# Patient Record
Sex: Female | Born: 1965 | Race: Black or African American | Hispanic: No | Marital: Single | Smoking: Never smoker
Health system: Southern US, Community
[De-identification: ages and names within clinical notes are randomized; demographics above are authoritative.]

## PROBLEM LIST (undated history)

## (undated) DIAGNOSIS — F79 Unspecified intellectual disabilities: Secondary | ICD-10-CM

## (undated) DIAGNOSIS — K219 Gastro-esophageal reflux disease without esophagitis: Secondary | ICD-10-CM

## (undated) DIAGNOSIS — F209 Schizophrenia, unspecified: Secondary | ICD-10-CM

## (undated) DIAGNOSIS — J45909 Unspecified asthma, uncomplicated: Secondary | ICD-10-CM

## (undated) DIAGNOSIS — K529 Noninfective gastroenteritis and colitis, unspecified: Secondary | ICD-10-CM

## (undated) HISTORY — DX: Gastro-esophageal reflux disease without esophagitis: K21.9

## (undated) HISTORY — DX: Noninfective gastroenteritis and colitis, unspecified: K52.9

## (undated) HISTORY — DX: Unspecified intellectual disabilities: F79

## (undated) HISTORY — DX: Schizophrenia, unspecified: F20.9

## (undated) HISTORY — PX: OTHER SURGICAL HISTORY: SHX169

---

## 2014-09-03 ENCOUNTER — Encounter: Payer: Self-pay | Admitting: Internal Medicine

## 2014-09-17 ENCOUNTER — Encounter: Payer: Self-pay | Admitting: Gastroenterology

## 2014-09-17 ENCOUNTER — Telehealth: Payer: Self-pay | Admitting: Gastroenterology

## 2014-09-17 ENCOUNTER — Ambulatory Visit (INDEPENDENT_AMBULATORY_CARE_PROVIDER_SITE_OTHER): Payer: Medicaid Other | Admitting: Gastroenterology

## 2014-09-17 ENCOUNTER — Other Ambulatory Visit: Payer: Self-pay

## 2014-09-17 VITALS — BP 117/77 | HR 73 | Temp 97.8°F | Ht 60.0 in | Wt 176.2 lb

## 2014-09-17 DIAGNOSIS — R197 Diarrhea, unspecified: Secondary | ICD-10-CM

## 2014-09-17 DIAGNOSIS — R1314 Dysphagia, pharyngoesophageal phase: Secondary | ICD-10-CM | POA: Insufficient documentation

## 2014-09-17 DIAGNOSIS — R109 Unspecified abdominal pain: Secondary | ICD-10-CM

## 2014-09-17 MED ORDER — ELUXADOLINE 100 MG PO TABS
1.0000 | ORAL_TABLET | Freq: Two times a day (BID) | ORAL | Status: DC
Start: 2014-09-17 — End: 2014-11-06

## 2014-09-17 MED ORDER — PANTOPRAZOLE SODIUM 40 MG PO TBEC: 40.0000 mg | DELAYED_RELEASE_TABLET | Freq: Every day | ORAL | Status: DC

## 2014-09-17 MED ORDER — PEG 3350-KCL-NA BICARB-NACL 420 G PO SOLR: 4000.0000 mL | Freq: Once | ORAL | Status: DC

## 2014-09-17 NOTE — Telephone Encounter (Signed)
I spoke with Evelyn Allen and she is aware of medication change.

## 2014-09-17 NOTE — Assessment & Plan Note (Signed)
Chronic, pointing to cervical and mid esophagus as culprit, refractory GERD noted despite Prilosec 40 mg daily and Zantac BID. Will proceed with EGD with dilation as appropriate. Query uncontrolled GERD, possible web, ring, or stricture.   Proceed with upper endoscopy and dilation in the near future with Dr. Jena Gaussourk. The risks, benefits, and alternatives have been discussed in detail with patient. They have stated understanding and desire to proceed.  PROPOFOL due to polypharmacy Will trial Protonix 40 mg daily in interim (discontinue Prilosec to exclude any contributor to diarrhea or abdominal pain)

## 2014-09-17 NOTE — Telephone Encounter (Signed)
After patient left, I decided to have her stop Prilosec and start Protonix 40 mg once daily. I have sent this to the pharmacy. Please call the home and make them aware. Thanks!

## 2014-09-17 NOTE — Patient Instructions (Signed)
We have scheduled you for a colonoscopy, upper endoscopy, and possible dilation with Dr. Jena Gaussourk in the near future.  For diarrhea and belly pain: start taking Viberzi 1 tablet WITH FOOD twice a day. IF you have worsening abdominal pain, nausea, vomiting, or severe constipation, call us immediately.

## 2014-09-17 NOTE — Assessment & Plan Note (Addendum)
49 year old mentally-challenged female with history of chronic diarrhea and abdominal pain, with unrevealing labs thus far and reportedly normal ultrasound on file, likely dealing with IBS but unable to exclude occult etiology. With her history of schizophrenia and mentally challenged, it is difficult to obtain a complete history regarding abdominal pain and signs/symptoms. As diarrhea appears chronic and is not an acute finding, will hold on stool studies and empirically trial Viberzi 100 mg BID with food in interim until colonoscopy completed. She has never had a lower GI evaluation and is overdue as she is African-American and age 49. Unknown family history. Low-volume intermittent hematochezia likely benign anorectal source.  Will proceed with colonoscopy in the near future.   Proceed with TCS with Dr. Jena Gaussourk in near future: the risks, benefits, and alternatives have been discussed with the patient in detail. The patient states understanding and desires to proceed. PROPOFOL due to polypharmacy 2 days of clear liquids prior to assist with prepping Viberzi 100 mg BID with food: discussed side effects and warning signs/symptoms that would prompt attention

## 2014-09-17 NOTE — Progress Notes (Signed)
cc'ed to pcp °

## 2014-09-17 NOTE — Assessment & Plan Note (Signed)
Likely multifactorial. No significant concerning signs; she has lost a few pounds in the past month but appetite remains good. US reportedly normal and will request results. Labs unimpressive. Although gallbladder remains in situ, doubt biliary component. Proceeding with colonoscopy and EGD due to dysphagia. Consider HIDA if all normal and any biliary type symptoms.

## 2014-09-17 NOTE — Progress Notes (Signed)
    Primary Care Physician:  [facility] Primary Gastroenterologist:  Dr. Rourk   Chief Complaint  Patient presents with  . Abdominal Pain  . Diarrhea    HPI:   Evelyn Allen is a 49 y.o. female presenting today at the request of Anthony Roberson, PA, secondary to chronic abdominal pain and diarrhea. She is here with her caregiver, who she calls "Momma". History of schizophrenia and mentally challenged. However, she is able to give a limited history.   Abdominal pain located at navel, intermittent for approximately 2 years.  Pain worsened after eating. No N/V. Caregiver states back and forth to the bathroom "all day long". Possible low-volume hematochezia. No prior colonoscopy.   Points to cervical and mid esophagus complaining of pain all the time. Food gets "hung" sometimes.Heartburn/indigestion at times. Prilosec 40 mg each day and Zantac BID. Possibly 3 lbs weight loss in past month. Good appetite.   Resides at [facility]. Reportedly ultrasound of abdomen normal per PCP notes, but this is not available at time of visit. Labs listed below, unimpressive.    Past Medical History  Diagnosis Date  . GERD (gastroesophageal reflux disease)   . Chronic diarrhea   . Mentally challenged   . Schizophrenia     Past Surgical History  Procedure Laterality Date  . None      Current Outpatient Prescriptions  Medication Sig Dispense Refill  . acetaminophen (TYLENOL) 500 MG tablet Take 500 mg by mouth every 6 (six) hours as needed.    . albuterol (PROVENTIL HFA;VENTOLIN HFA) 108 (90 BASE) MCG/ACT inhaler Inhale into the lungs every 6 (six) hours as needed for wheezing or shortness of breath.    . ARIPiprazole (ABILIFY) 30 MG tablet Take 30 mg by mouth daily.    . carbamazepine (TEGRETOL) 200 MG tablet Take 200 mg by mouth 2 (two) times daily.    . cetirizine (ZYRTEC) 10 MG tablet Take 10 mg by mouth daily.    . fluticasone (VERAMYST) 27.5  MCG/SPRAY nasal spray Place 2 sprays into the nose daily.    . Fluticasone-Salmeterol (ADVAIR) 250-50 MCG/DOSE AEPB Inhale 1 puff into the lungs 2 (two) times daily.    . gabapentin (NEURONTIN) 400 MG capsule Take 400 mg by mouth 3 (three) times daily.    . guanFACINE (TENEX) 1 MG tablet Take 1 mg by mouth at bedtime. 4 tablets at bedtime    . Homeopathic Products (SIMILASAN ALLERGY EYE RELIEF OP) Apply to eye. As directed    . Melatonin 3 MG CAPS Take by mouth at bedtime.    . naproxen (NAPROSYN) 500 MG tablet Take 500 mg by mouth daily.    . NON FORMULARY Calcium 500 plus Vit D 400   bid    . omeprazole (PRILOSEC) 40 MG capsule Take 40 mg by mouth daily.    . prazosin (MINIPRESS) 1 MG capsule Take 1 mg by mouth at bedtime.    . ranitidine (ZANTAC) 150 MG capsule Take 150 mg by mouth 2 (two) times daily.    . traZODone (DESYREL) 100 MG tablet Take 100 mg by mouth at bedtime. Two tablets at bedtime     No current facility-administered medications for this visit.    Allergies as of 09/17/2014 - Review Complete 09/17/2014  Allergen Reaction Noted  . Penicillins Itching and Rash 09/17/2014    Family History  Problem Relation Age of Onset  . Colon cancer      unknown       History   Social History  . Marital Status: Single    Spouse Name: N/A  . Number of Children: N/A  . Years of Education: N/A   Occupational History  . Not on file.   Social History Main Topics  . Smoking status: Never Smoker   . Smokeless tobacco: Not on file     Comment: Never smoked  . Alcohol Use: No  . Drug Use: No  . Sexual Activity: Not on file   Other Topics Concern  . Not on file   Social History Narrative  . No narrative on file    Review of Systems: Gen: Denies any fever, chills, fatigue, weight loss, lack of appetite.  CV: +palpitations Resp: +DOE GI: see HPI GU : Denies urinary burning, urinary frequency, urinary hesitancy MS: Denies joint pain, muscle weakness, cramps, or limitation  of movement.  Derm: Denies rash, itching, dry skin Psych: Denies depression, anxiety, memory loss, and confusion Heme: Denies bruising, bleeding, and enlarged lymph nodes.  Physical Exam: BP 117/77 mmHg  Pulse 73  Temp(Src) 97.8 F (36.6 C) (Oral)  Ht 5' (1.524 m)  Wt 176 lb 3.2 oz (79.924 kg)  BMI 34.41 kg/m2 General:   Alert and oriented. Flat affect but responds appropriately to questions.  Head:  Normocephalic and atraumatic. Eyes:  Without icterus, sclera clear and conjunctiva pink.  Ears:  Normal auditory acuity. Nose:  No deformity, discharge,  or lesions. Mouth:  No deformity or lesions, oral mucosa pink.  Lungs:  Clear to auscultation bilaterally. No wheezes, rales, or rhonchi. No distress.  Heart:  S1, S2 present without murmurs appreciated.  Abdomen:  +BS, soft, mild TTP diffusely but moreso upper abdomen and non-distended. No HSM noted. No guarding or rebound. No masses appreciated.  Rectal:  Deferred  Msk:  Symmetrical without gross deformities. Normal posture. Extremities:  Without edema. Neurologic:  Alert and  oriented to person, situation Skin:  Intact without significant lesions or rashes. Psych:  Alert and cooperative. Normal mood and affect.  Outside labs April 2016:  Tbili 0.4, Alk Phos 134, AST 16, ALT 12, Lipase 28, GGT normal at 54, Hgb 14.3, Hct 45.4, Platelets 151 

## 2014-09-19 ENCOUNTER — Telehealth: Payer: Self-pay | Admitting: Nurse Practitioner

## 2014-09-19 NOTE — Telephone Encounter (Signed)
Received a call about this patient. She was seen by AS this past Tuesday and started on Viberzi. Patient now having worsening abdominal pain. No nausea/vomiting. Advised the patient to stop taking the medication. If her pain worsens or does not improve she should be evaluated in the ER.

## 2014-09-22 NOTE — Telephone Encounter (Signed)
Agree 

## 2014-09-26 NOTE — Patient Instructions (Signed)
Evelyn Allen  09/26/2014     @PREFPERIOPPHARMACY @   Your procedure is scheduled on 10/02/2014 Report to [facility] at  930  A.M.  Call this number if you have problems the morning of surgery:  [phone]   Remember:  Do not eat food or drink liquids after midnight.  Take these medicines the morning of surgery with A SIP OF WATER  Abilify, tegretol, zyrtec, neurontin, protonix,minipress, zantac. Take your inhalers before you come and bring them with you.   Do not wear jewelry, make-up or nail polish.  Do not wear lotions, powders, or perfumes.    Do not shave 48 hours prior to surgery.  Men may shave face and neck.  Do not bring valuables to the hospital.  [facility] is not responsible for any belongings or valuables.  Contacts, dentures or bridgework may not be worn into surgery.  Leave your suitcase in the car.  After surgery it may be brought to your room.  For patients admitted to the hospital, discharge time will be determined by your treatment team.  Patients discharged the day of surgery will not be allowed to drive home.   Name and phone number of your driver:   family Special instructions:  none  Please read over the following fact sheets that you were given. Pain Booklet, Coughing and Deep Breathing, Surgical Site Infection Prevention, Anesthesia Post-op Instructions and Care and Recovery After Surgery      Esophagogastroduodenoscopy Esophagogastroduodenoscopy (EGD) is a procedure to examine the lining of the esophagus, stomach, and first part of the small intestine (duodenum). A long, flexible, lighted tube with a camera attached (endoscope) is inserted down the throat to view these organs. This procedure is done to detect problems or abnormalities, such as inflammation, bleeding, ulcers, or growths, in order to treat them. The procedure lasts about 5-20 minutes. It is usually an outpatient procedure, but it may need to be performed in emergency  cases in the hospital. LET YOUR CAREGIVER KNOW ABOUT:   Allergies to food or medicine.  All medicines you are taking, including vitamins, herbs, eyedrops, and over-the-counter medicines and creams.  Use of steroids (by mouth or creams).  Previous problems you or members of your family have had with the use of anesthetics.  Any blood disorders you have.  Previous surgeries you have had.  Other health problems you have.  Possibility of pregnancy, if this applies. RISKS AND COMPLICATIONS  Generally, EGD is a safe procedure. However, as with any procedure, complications can occur. Possible complications include:  Infection.  Bleeding.  Tearing (perforation) of the esophagus, stomach, or duodenum.  Difficulty breathing or not being able to breath.  Excessive sweating.  Spasms of the larynx.  Slowed heartbeat.  Low blood pressure. BEFORE THE PROCEDURE  Do not eat or drink anything for 6-8 hours before the procedure or as directed by your caregiver.  Ask your caregiver about changing or stopping your regular medicines.  If you wear dentures, be prepared to remove them before the procedure.  Arrange for someone to drive you home after the procedure. PROCEDURE   A vein will be accessed to give medicines and fluids. A medicine to relax you (sedative) and a pain reliever will be given through that access into the vein.  A numbing medicine (local anesthetic) may be sprayed on your throat for comfort and to stop you from gagging or coughing.  A mouth guard may be placed  in your mouth to protect your teeth and to keep you from biting on the endoscope.  You will be asked to lie on your left side.  The endoscope is inserted down your throat and into the esophagus, stomach, and duodenum.  Air is put through the endoscope to allow your caregiver to view the lining of your esophagus clearly.  The esophagus, stomach, and duodenum is then examined. During the exam, your  caregiver may:  Remove tissue to be examined under a microscope (biopsy) for inflammation, infection, or other medical problems.  Remove growths.  Remove objects (foreign bodies) that are stuck.  Treat any bleeding with medicines or other devices that stop tissues from bleeding (hot cautery, clipping devices).  Widen (dilate) or stretch narrowed areas of the esophagus and stomach.  The endoscope will then be withdrawn. AFTER THE PROCEDURE  You will be taken to a recovery area to be monitored. You will be able to go home once you are stable and alert.  Do not eat or drink anything until the local anesthetic and numbing medicines have worn off. You may choke.  It is normal to feel bloated, have pain with swallowing, or have a sore throat for a short time. This will wear off.  Your caregiver should be able to discuss his or her findings with you. It will take longer to discuss the test results if any biopsies were taken. Document Released: 08/19/2004 Document Revised: 09/02/2013 Document Reviewed: 03/21/2012 [facility] Patient Information 2015 [place], [place]. This information is not intended to replace advice given to you by your health care provider. Make sure you discuss any questions you have with your health care provider. Esophageal Dilatation The esophagus is the long, narrow tube which carries food and liquid from the mouth to the stomach. Esophageal dilatation is the technique used to stretch a blocked or narrowed portion of the esophagus. This procedure is used when a part of the esophagus has become so narrow that it becomes difficult, painful or even impossible to swallow. This is generally an uncomplicated form of treatment. When this is not successful, chest surgery may be required. This is a much more extensive form of treatment with a longer recovery time. CAUSES  Some of the more common causes of blockage or strictures of the esophagus are:  Narrowing from longstanding  inflammation (soreness and redness) of the lower esophagus. This comes from the constant exposure of the lower esophagus to the acid which bubbles up from the stomach. Over time this causes scarring and narrowing of the lower esophagus.  Hiatal hernia in which a small part of the stomach bulges (herniates) up through the diaphragm. This can cause a gradual narrowing of the end of the esophagus.  Schatzki ring is a narrow ring of benign (non-cancerous) fibrous tissue which constricts the lower esophagus. The reason for this is not known.  Scleroderma is a connective tissue disorder that affects the esophagus and makes swallowing difficult.  Achalasia is an absence of nerves to the lower esophagus and to the esophageal sphincter. This is the circular muscle between the stomach and esophagus that relaxes to allow food into the stomach. After swallowing, it contracts to keep food in the stomach. This absence of nerves may be congenital (present since birth). This can cause irregular spasms of the lower esophageal muscle. This spasm does not open up to allow food and fluid through. The result is a persistent blockage with subsequent slow trickling of the esophageal contents into the stomach.  Strictures  may develop from swallowing materials which damage the esophagus. Some examples are strong acids or alkalis such as lye.  Growths such as benign (non-cancerous) and malignant (cancerous) tumors can block the esophagus.  Hereditary (present since birth) causes. DIAGNOSIS  Your caregiver often suspects this problem by taking a medical history. They will also do a physical exam. They can then prove their suspicions using X-rays and endoscopy. Endoscopy is an exam in which a tube like a small, flexible telescope is used to look at your esophagus.  TREATMENT There are different stretching (dilating) techniques that can be used. Simple bougie dilatation may be done in the office. This usually takes only a  couple minutes. A numbing (anesthetic) spray of the throat is used. Endoscopy, when done, is done in an endoscopy suite under mild sedation. When fluoroscopy is used, the procedure is performed in X-ray. Other techniques require a little longer time. Recovery is usually quick. There is no waiting time to begin eating and drinking to test success of the treatment. Following are some of the methods used. Narrowing of the esophagus is treated by making it bigger. Commonly this is a mechanical problem which can be treated with stretching. This can be done in different ways. Your caregiver will discuss these with you. Some of the means used are:  A series of graduated (increasing thickness) flexible dilators can be used. These are weighted tubes passed through the esophagus into the stomach. The tubes used become progressively larger until the desired stretched size is reached. Graduated dilators are a simple and quick way of opening the esophagus. No visualization is required.  Another method is the use of endoscopy to place a flexible wire across the stricture. The endoscope is removed and the wire left in place. A dilator with a hole through it from end to end is guided down the esophagus and across the stricture. One or more of these dilators are passed over the wire. At the end of the exam, the wire is removed. This type of treatment may be performed in the X-ray department under fluoroscopy. An advantage of this procedure is the examiner is visualizing the end opening in the esophagus.  Stretching of the esophagus may be done using balloons. Deflated balloons are placed through the endoscope and across the stricture. This type of balloon dilatation is often done at the time of endoscopy or fluoroscopy. Flexible endoscopy allows the examiner to directly view the stricture. A balloon is inserted in the deflated form into the area of narrowing. It is then inflated with air to a certain pressure that is preset  for a given circumference. When inflated, it becomes sausage shaped, stretched, and makes the stricture larger.  Achalasia requires a longer, larger balloon-type dilator. This is frequently done under X-ray control. In this situation, the spastic muscle fibers in the lower esophagus are stretched. All of the above procedures make the passage of food and water into the stomach easier. They also make it easier for stomach contents to reflux back into the esophagus. Special medications may be used following the procedure to help prevent further stricturing. Proton-pump inhibitor medications are good at decreasing the amount of acid in the stomach juice. When stomach juice refluxes into the esophagus, the juice is no longer as acidic and is less likely to burn or scar the esophagus. RISKS AND COMPLICATIONS Esophageal dilatation is usually performed effectively and without problems. Some complications that can occur are:  A small amount of bleeding almost always happens where  the stretching takes place. If this is too excessive it may require more aggressive treatment.  An uncommon complication is perforation (making a hole) of the esophagus. The esophagus is thin. It is easy to make a hole in it. If this happens, an operation may be necessary to repair this.  A small, undetected perforation could lead to an infection in the chest. This can be very serious. HOME CARE INSTRUCTIONS   If you received sedation for your procedure, do not drive, make important decisions, or perform any activities requiring your full coordination. Do not drink alcohol, take sedatives, or use any mind altering chemicals unless instructed by your caregiver.  You may use throat lozenges or warm salt water gargles if you have throat discomfort.  You can begin eating and drinking normally on return home unless instructed otherwise. Do not purposely try to force large chunks of food down to test the benefits of your  procedure.  Mild discomfort can be eased with sips of ice water.  Medications for discomfort may or may not be needed. SEEK IMMEDIATE MEDICAL CARE IF:   You begin vomiting up blood.  You develop black, tarry stools.  You develop chills or an unexplained temperature of over 101F (38.3C)  You develop chest or abdominal pain.  You develop shortness of breath, or feel light-headed or faint.  Your swallowing is becoming more painful, difficult, or you are unable to swallow. MAKE SURE YOU:   Understand these instructions.  Will watch your condition.  Will get help right away if you are not doing well or get worse. Document Released: 06/09/2005 Document Revised: 09/02/2013 Document Reviewed: 07/27/2005 [facility] Patient Information 2015 [place], [place]. This information is not intended to replace advice given to you by your health care provider. Make sure you discuss any questions you have with your health care provider. Colonoscopy A colonoscopy is an exam to look at the entire large intestine (colon). This exam can help find problems such as tumors, polyps, inflammation, and areas of bleeding. The exam takes about 1 hour.  LET [facility] CARE PROVIDER KNOW ABOUT:   Any allergies you have.  All medicines you are taking, including vitamins, herbs, eye drops, creams, and over-the-counter medicines.  Previous problems you or members of your family have had with the use of anesthetics.  Any blood disorders you have.  Previous surgeries you have had.  Medical conditions you have. RISKS AND COMPLICATIONS  Generally, this is a safe procedure. However, as with any procedure, complications can occur. Possible complications include:  Bleeding.  Tearing or rupture of the colon wall.  Reaction to medicines given during the exam.  Infection (rare). BEFORE THE PROCEDURE   Ask your health care provider about changing or stopping your regular medicines.  You may be prescribed an  oral bowel prep. This involves drinking a large amount of medicated liquid, starting the day before your procedure. The liquid will cause you to have multiple loose stools until your stool is almost clear or light green. This cleans out your colon in preparation for the procedure.  Do not eat or drink anything else once you have started the bowel prep, unless your health care provider tells you it is safe to do so.  Arrange for someone to drive you home after the procedure. PROCEDURE   You will be given medicine to help you relax (sedative).  You will lie on your side with your knees bent.  A long, flexible tube with a light and camera on  the end (colonoscope) will be inserted through the rectum and into the colon. The camera sends video back to a computer screen as it moves through the colon. The colonoscope also releases carbon dioxide gas to inflate the colon. This helps your health care provider see the area better.  During the exam, your health care provider may take a small tissue sample (biopsy) to be examined under a microscope if any abnormalities are found.  The exam is finished when the entire colon has been viewed. AFTER THE PROCEDURE   Do not drive for 24 hours after the exam.  You may have a small amount of blood in your stool.  You may pass moderate amounts of gas and have mild abdominal cramping or bloating. This is caused by the gas used to inflate your colon during the exam.  Ask when your test results will be ready and how you will get your results. Make sure you get your test results. Document Released: 04/15/2000 Document Revised: 02/06/2013 Document Reviewed: 12/24/2012 [facility] Patient Information 2015 [place], [place]. This information is not intended to replace advice given to you by your health care provider. Make sure you discuss any questions you have with your health care provider. PATIENT INSTRUCTIONS POST-ANESTHESIA  IMMEDIATELY FOLLOWING SURGERY:  Do not  drive or operate machinery for the first twenty four hours after surgery.  Do not make any important decisions for twenty four hours after surgery or while taking narcotic pain medications or sedatives.  If you develop intractable nausea and vomiting or a severe headache please notify your doctor immediately.  FOLLOW-UP:  Please make an appointment with your surgeon as instructed. You do not need to follow up with anesthesia unless specifically instructed to do so.  WOUND CARE INSTRUCTIONS (if applicable):  Keep a dry clean dressing on the anesthesia/puncture wound site if there is drainage.  Once the wound has quit draining you may leave it open to air.  Generally you should leave the bandage intact for twenty four hours unless there is drainage.  If the epidural site drains for more than 36-48 hours please call the anesthesia department.  QUESTIONS?:  Please feel free to call your physician or the hospital operator if you have any questions, and they will be happy to assist you.

## 2014-09-30 ENCOUNTER — Other Ambulatory Visit: Payer: Self-pay

## 2014-09-30 ENCOUNTER — Encounter (HOSPITAL_COMMUNITY): Payer: Self-pay

## 2014-09-30 ENCOUNTER — Telehealth: Payer: Self-pay | Admitting: Internal Medicine

## 2014-09-30 ENCOUNTER — Encounter (HOSPITAL_COMMUNITY)
Admission: RE | Admit: 2014-09-30 | Discharge: 2014-09-30 | Disposition: A | Discharge status: Home / Self Care | Payer: Medicaid Other | Source: Ambulatory Visit | Attending: Internal Medicine | Admitting: Internal Medicine

## 2014-09-30 DIAGNOSIS — Z01818 Encounter for other preprocedural examination: Secondary | ICD-10-CM | POA: Insufficient documentation

## 2014-09-30 DIAGNOSIS — R197 Diarrhea, unspecified: Secondary | ICD-10-CM | POA: Insufficient documentation

## 2014-09-30 HISTORY — DX: Unspecified asthma, uncomplicated: J45.909

## 2014-09-30 LAB — BASIC METABOLIC PANEL
Anion gap: 9 (ref 5–15)
BUN: 9 mg/dL (ref 6–20)
CHLORIDE: 104 mmol/L (ref 101–111)
CO2: 25 mmol/L (ref 22–32)
Calcium: 9.6 mg/dL (ref 8.9–10.3)
Creatinine, Ser: 0.99 mg/dL (ref 0.44–1.00)
GFR calc Af Amer: >60 mL/min (ref >60)
GLUCOSE: 97 mg/dL (ref 65–99)
POTASSIUM: 4.7 mmol/L (ref 3.5–5.1)
Sodium: 138 mmol/L (ref 135–145)

## 2014-09-30 LAB — HCG, SERUM, QUALITATIVE: PREG SERUM: NEGATIVE

## 2014-09-30 LAB — CBC WITH DIFFERENTIAL/PLATELET
Basophils Absolute: 0 10*3/uL (ref 0.0–0.1)
Basophils Relative: 0 % (ref 0–1)
Eosinophils Absolute: 0 10*3/uL (ref 0.0–0.7)
Eosinophils Relative: 0 % (ref 0–5)
HCT: 42.9 % (ref 36.0–46.0)
HEMOGLOBIN: 14.1 g/dL (ref 12.0–15.0)
LYMPHS ABS: 2.5 10*3/uL (ref 0.7–4.0)
Lymphocytes Relative: 29 % (ref 12–46)
MCH: 30.4 pg (ref 26.0–34.0)
MCHC: 32.9 g/dL (ref 30.0–36.0)
MCV: 92.5 fL (ref 78.0–100.0)
MONO ABS: 0.8 10*3/uL (ref 0.1–1.0)
Monocytes Relative: 9 % (ref 3–12)
Neutro Abs: 5.4 10*3/uL (ref 1.7–7.7)
Neutrophils Relative %: 62 % (ref 43–77)
PLATELETS: 168 10*3/uL (ref 150–400)
RBC: 4.64 MIL/uL (ref 3.87–5.11)
RDW: 12.7 % (ref 11.5–15.5)
WBC: 8.6 10*3/uL (ref 4.0–10.5)

## 2014-09-30 NOTE — Telephone Encounter (Signed)
The caregiver for patient came to front window this morning saying that she needed a consent form stating why the patient needed this procedure done and have it faxed to [phone] before her procedure on 10/02/14. She is a ward of the state according to her caregiver. Any questions you can call her at [phone]

## 2014-09-30 NOTE — Telephone Encounter (Signed)
Spoke with Selena BattenKim at Day surgery and she has contact numbers for pts guardian to sign consent

## 2014-09-30 NOTE — Pre-Procedure Instructions (Signed)
Caregiver, Dolores HooseBrenda Corbitt given inforamtion to sign up for my chart at home.

## 2014-10-02 ENCOUNTER — Ambulatory Visit (HOSPITAL_COMMUNITY)
Admission: RE | Admit: 2014-10-02 | Discharge: 2014-10-02 | Disposition: A | Discharge status: Home / Self Care | Payer: Medicaid Other | Source: Ambulatory Visit | Attending: Internal Medicine | Admitting: Internal Medicine

## 2014-10-02 ENCOUNTER — Encounter (HOSPITAL_COMMUNITY): Payer: Self-pay | Admitting: *Deleted

## 2014-10-02 ENCOUNTER — Encounter (HOSPITAL_COMMUNITY)
Admission: RE | Disposition: A | Discharge status: Home / Self Care | Payer: Self-pay | Source: Ambulatory Visit | Attending: Internal Medicine

## 2014-10-02 ENCOUNTER — Ambulatory Visit (HOSPITAL_COMMUNITY): Payer: Medicaid Other | Admitting: Anesthesiology

## 2014-10-02 ENCOUNTER — Other Ambulatory Visit: Payer: Self-pay

## 2014-10-02 DIAGNOSIS — R1084 Generalized abdominal pain: Secondary | ICD-10-CM | POA: Insufficient documentation

## 2014-10-02 DIAGNOSIS — Z1211 Encounter for screening for malignant neoplasm of colon: Secondary | ICD-10-CM | POA: Insufficient documentation

## 2014-10-02 DIAGNOSIS — F79 Unspecified intellectual disabilities: Secondary | ICD-10-CM | POA: Diagnosis not present

## 2014-10-02 DIAGNOSIS — R197 Diarrhea, unspecified: Secondary | ICD-10-CM | POA: Diagnosis present

## 2014-10-02 DIAGNOSIS — K5730 Diverticulosis of large intestine without perforation or abscess without bleeding: Secondary | ICD-10-CM | POA: Insufficient documentation

## 2014-10-02 DIAGNOSIS — F209 Schizophrenia, unspecified: Secondary | ICD-10-CM | POA: Diagnosis not present

## 2014-10-02 DIAGNOSIS — K529 Noninfective gastroenteritis and colitis, unspecified: Secondary | ICD-10-CM

## 2014-10-02 DIAGNOSIS — K219 Gastro-esophageal reflux disease without esophagitis: Secondary | ICD-10-CM | POA: Diagnosis not present

## 2014-10-02 DIAGNOSIS — J45909 Unspecified asthma, uncomplicated: Secondary | ICD-10-CM | POA: Insufficient documentation

## 2014-10-02 DIAGNOSIS — Z791 Long term (current) use of non-steroidal anti-inflammatories (NSAID): Secondary | ICD-10-CM | POA: Diagnosis not present

## 2014-10-02 DIAGNOSIS — R1314 Dysphagia, pharyngoesophageal phase: Secondary | ICD-10-CM

## 2014-10-02 DIAGNOSIS — R109 Unspecified abdominal pain: Secondary | ICD-10-CM

## 2014-10-02 DIAGNOSIS — Z7951 Long term (current) use of inhaled steroids: Secondary | ICD-10-CM | POA: Diagnosis not present

## 2014-10-02 DIAGNOSIS — Z79899 Other long term (current) drug therapy: Secondary | ICD-10-CM | POA: Diagnosis not present

## 2014-10-02 DIAGNOSIS — R1319 Other dysphagia: Secondary | ICD-10-CM | POA: Insufficient documentation

## 2014-10-02 HISTORY — PX: ESOPHAGEAL DILATION: SHX303

## 2014-10-02 HISTORY — PX: BIOPSY: SHX5522

## 2014-10-02 HISTORY — PX: COLONOSCOPY WITH PROPOFOL: SHX5780

## 2014-10-02 HISTORY — PX: ESOPHAGOGASTRODUODENOSCOPY (EGD) WITH PROPOFOL: SHX5813

## 2014-10-02 SURGERY — COLONOSCOPY WITH PROPOFOL
Anesthesia: Monitor Anesthesia Care

## 2014-10-02 MED ORDER — LIDOCAINE VISCOUS 2 % MT SOLN
5.0000 mL | Freq: Two times a day (BID) | OROMUCOSAL | Status: AC
  Administered 2014-10-02 (×2): 5 mL via OROMUCOSAL
  Filled 2014-10-02: qty 15

## 2014-10-02 MED ORDER — WATER FOR IRRIGATION, STERILE IR SOLN
Status: DC | PRN
  Administered 2014-10-02: 1000 mL

## 2014-10-02 MED ORDER — ONDANSETRON HCL 4 MG/2ML IJ SOLN
INTRAMUSCULAR | Status: AC
  Filled 2014-10-02: qty 2

## 2014-10-02 MED ORDER — PROPOFOL 10 MG/ML IV BOLUS
INTRAVENOUS | Status: AC
  Filled 2014-10-02: qty 20

## 2014-10-02 MED ORDER — FENTANYL CITRATE (PF) 100 MCG/2ML IJ SOLN
INTRAMUSCULAR | Status: AC
  Filled 2014-10-02: qty 2

## 2014-10-02 MED ORDER — MIDAZOLAM HCL 2 MG/2ML IJ SOLN
INTRAMUSCULAR | Status: AC
  Filled 2014-10-02: qty 2

## 2014-10-02 MED ORDER — STERILE WATER FOR IRRIGATION IR SOLN
Status: DC | PRN
  Administered 2014-10-02: 1000 mL

## 2014-10-02 MED ORDER — ONDANSETRON HCL 4 MG/2ML IJ SOLN: 4.0000 mg | Freq: Once | INTRAMUSCULAR | Status: DC | PRN

## 2014-10-02 MED ORDER — FENTANYL CITRATE (PF) 100 MCG/2ML IJ SOLN
25.0000 ug | INTRAMUSCULAR | Status: AC
  Administered 2014-10-02 (×2): 25 ug via INTRAVENOUS

## 2014-10-02 MED ORDER — PROPOFOL INFUSION 10 MG/ML OPTIME
INTRAVENOUS | Status: DC | PRN
  Administered 2014-10-02: 09:00:00 via INTRAVENOUS
  Administered 2014-10-02: 100 ug/kg/min via INTRAVENOUS

## 2014-10-02 MED ORDER — PROPOFOL 10 MG/ML IV BOLUS
INTRAVENOUS | Status: DC | PRN
  Administered 2014-10-02: 20 mg via INTRAVENOUS

## 2014-10-02 MED ORDER — ONDANSETRON HCL 4 MG/2ML IJ SOLN
4.0000 mg | Freq: Once | INTRAMUSCULAR | Status: AC
  Administered 2014-10-02: 4 mg via INTRAVENOUS

## 2014-10-02 MED ORDER — MIDAZOLAM HCL 2 MG/2ML IJ SOLN
1.0000 mg | INTRAMUSCULAR | Status: DC | PRN
  Administered 2014-10-02: 2 mg via INTRAVENOUS

## 2014-10-02 MED ORDER — FENTANYL CITRATE (PF) 100 MCG/2ML IJ SOLN: 25.0000 ug | INTRAMUSCULAR | Status: DC | PRN

## 2014-10-02 MED ORDER — LACTATED RINGERS IV SOLN
INTRAVENOUS | Status: DC
  Administered 2014-10-02: 1000 mL via INTRAVENOUS

## 2014-10-02 MED ORDER — MIDAZOLAM HCL 5 MG/5ML IJ SOLN
INTRAMUSCULAR | Status: DC | PRN
  Administered 2014-10-02 (×2): 1 mg via INTRAVENOUS

## 2014-10-02 SURGICAL SUPPLY — 34 items
BALLN CRE LF 10-12 240X5.5 (BALLOONS)
BALLN CRE LF 10-12MM 240X5.5 (BALLOONS)
BALLN DILATOR CRE 12-15 240 (BALLOONS)
BALLN DILATOR CRE 15-18 240 (BALLOONS) IMPLANT
BALLN DILATOR CRE 18-20 240 (BALLOONS) IMPLANT
BALLN DILATOR CRE WIREGUIDE (BALLOONS)
BALLOON CRE LF 10-12 240X5.5 (BALLOONS) IMPLANT
BALLOON DILATOR CRE 12-15 240 (BALLOONS) IMPLANT
BALLOON DILATOR CRE WIREGUIDE (BALLOONS) IMPLANT
BLOCK BITE 60FR ADLT L/F BLUE (MISCELLANEOUS) ×3 IMPLANT
DEVICE CLIP HEMOSTAT 235CM (CLIP) IMPLANT
ELECT REM PT RETURN 9FT ADLT (ELECTROSURGICAL)
ELECTRODE REM PT RTRN 9FT ADLT (ELECTROSURGICAL) IMPLANT
FCP BXJMBJMB 240X2.8X (CUTTING FORCEPS)
FLOOR PAD 36X40 (MISCELLANEOUS) ×3
FORCEPS BIOP RAD 4 LRG CAP 4 (CUTTING FORCEPS) ×3 IMPLANT
FORCEPS BIOP RJ4 240 W/NDL (CUTTING FORCEPS)
FORCEPS BXJMBJMB 240X2.8X (CUTTING FORCEPS) IMPLANT
FORMALIN 10 PREFIL 20ML (MISCELLANEOUS) ×6 IMPLANT
INJECTOR/SNARE I SNARE (MISCELLANEOUS) IMPLANT
KIT ENDO PROCEDURE PEN (KITS) ×3 IMPLANT
MANIFOLD NEPTUNE II (INSTRUMENTS) ×3 IMPLANT
NEEDLE SCLEROTHERAPY 25GX240 (NEEDLE) IMPLANT
PAD FLOOR 36X40 (MISCELLANEOUS) ×1 IMPLANT
PROBE APC STR FIRE (PROBE) IMPLANT
PROBE INJECTION GOLD (MISCELLANEOUS)
PROBE INJECTION GOLD 7FR (MISCELLANEOUS) IMPLANT
SNARE ROTATE MED OVAL 20MM (MISCELLANEOUS) IMPLANT
SNARE SHORT THROW 13M SML OVAL (MISCELLANEOUS) IMPLANT
SYR INFLATE BILIARY GAUGE (MISCELLANEOUS) IMPLANT
SYR INFLATION 60ML (SYRINGE) IMPLANT
TRAP SPECIMEN MUCOUS 40CC (MISCELLANEOUS) IMPLANT
TUBING IRRIGATION ENDOGATOR (MISCELLANEOUS) ×3 IMPLANT
WATER STERILE IRR 1000ML POUR (IV SOLUTION) ×3 IMPLANT

## 2014-10-02 NOTE — Op Note (Signed)
[facility] [address] [place], [id number]   COLONOSCOPY PROCEDURE REPORT  PATIENT: Evelyn Allen, [name]  MR#: #[id number] BIRTHDATE: [birth year] , 49  yrs. old GENDER: female ENDOSCOPIST: R.  Roetta SessionsMichael Serita Degroote, MD Caleen EssexFACP FACG REFERRED XB:JYNWGNFBY:[facility] PROCEDURE DATE:  10/02/2014 PROCEDURE:   Ileo-colonoscopy with biopsy INDICATIONS:First-ever average risk colorectal cancer screening examination; chronic diarrhea. MEDICATIONS: Deep sedation per Dr[facility]  [facility]. ASA CLASS:       Class III  CONSENT: The risks, benefits, alternatives and imponderables including but not limited to bleeding, perforation as well as the possibility of a missed lesion have been reviewed.  The potential for biopsy, lesion removal, etc. have also been discussed. Questions have been answered.  All parties agreeable.  Please see the history and physical in the medical record for more information.  DESCRIPTION OF PROCEDURE:   After the risks benefits and alternatives of the procedure were thoroughly explained, informed consent was obtained.  The digital rectal exam revealed no abnormalities of the rectum.   The     endoscope was introduced through the anus and advanced to the terminal ileum which was intubated for a short distance. No adverse events experienced. The quality of the prep was adequate  The instrument was then slowly withdrawn as the colon was fully examined.      COLON FINDINGS: Normal-appearing rectal mucosa.  Normal-appearing colonic mucosa.  The distal 10 cm of terminal ileal mucosa also appeared normal.  Retroflexion was performed. .  Segmental biopsies of the ascending and descending/sigmoid segments taken for histologic study.  Withdrawal time=7 minutes 0 seconds.  The scope was withdrawn and the procedure completed. COMPLICATIONS: There were no immediate complications.  ENDOSCOPIC IMPRESSION: Normal ileo-colonoscopy?"status post segmental  biopsy  RECOMMENDATIONS: Follow up on pathology. See EGD report.  eSigned:  R. Roetta SessionsMichael Tammie Ellsworth, MD Jerrel IvoryFACP [facility] HospitalFACG 10/02/2014 8:51 AM   cc:  CPT CODES: ICD CODES:  The ICD and CPT codes recommended by this software are interpretations from the data that the clinical staff has captured with the software.  The verification of the translation of this report to the ICD and CPT codes and modifiers is the sole responsibility of the health care institution and practicing physician where this report was generated.  [employer] will not be held responsible for the validity of the ICD and CPT codes included on this report.  AMA assumes no liability for data contained or not contained herein. CPT is a Publishing rights managerregistered trademark of the [employer].  PATIENT NAME:  Evelyn Allen, [name] MR#: #[id number]

## 2014-10-02 NOTE — Discharge Instructions (Signed)
Colonoscopy Discharge Instructions  Read the instructions outlined below and refer to this sheet in the next few weeks. These discharge instructions provide you with general information on caring for yourself after you leave the hospital. Your doctor may also give you specific instructions. While your treatment has been planned according to the most current medical practices available, unavoidable complications occasionally occur. If you have any problems or questions after discharge, call Dr. Gala Romney at [phone]. ACTIVITY  You may resume your regular activity, but move at a slower pace for the next 24 hours.   Take frequent rest periods for the next 24 hours.   Walking will help get rid of the air and reduce the bloated feeling in your belly (abdomen).   No driving for 24 hours (because of the medicine (anesthesia) used during the test).    Do not sign any important legal documents or operate any machinery for 24 hours (because of the anesthesia used during the test).  NUTRITION  Drink plenty of fluids.   You may resume your normal diet as instructed by your doctor.   Begin with a light meal and progress to your normal diet. Heavy or fried foods are harder to digest and may make you feel sick to your stomach (nauseated).   Avoid alcoholic beverages for 24 hours or as instructed.  MEDICATIONS  You may resume your normal medications unless your doctor tells you otherwise.  WHAT YOU CAN EXPECT TODAY  Some feelings of bloating in the abdomen.   Passage of more gas than usual.   Spotting of blood in your stool or on the toilet paper.  IF YOU HAD POLYPS REMOVED DURING THE COLONOSCOPY:  No aspirin products for 7 days or as instructed.   No alcohol for 7 days or as instructed.   Eat a soft diet for the next 24 hours.  FINDING OUT THE RESULTS OF YOUR TEST Not all test results are available during your visit. If your test results are not back during the visit, make an appointment  with your caregiver to find out the results. Do not assume everything is normal if you have not heard from your caregiver or the medical facility. It is important for you to follow up on all of your test results.  SEEK IMMEDIATE MEDICAL ATTENTION IF:  You have more than a spotting of blood in your stool.   Your belly is swollen (abdominal distention).   You are nauseated or vomiting.   You have a temperature over 101.  You have abdominal pain or discomfort that is severe or gets worse throughout the day. EGD Discharge instructions Please read the instructions outlined below and refer to this sheet in the next few weeks. These discharge instructions provide you with general information on caring for yourself after you leave the hospital. Your doctor may also give you specific instructions. While your treatment has been planned according to the most current medical practices available, unavoidable complications occasionally occur. If you have any problems or questions after discharge, please call your doctor. ACTIVITY You may resume your regular activity but move at a slower pace for the next 24 hours.  Take frequent rest periods for the next 24 hours.  Walking will help expel (get rid of) the air and reduce the bloated feeling in your abdomen.  No driving for 24 hours (because of the anesthesia (medicine) used during the test).  You may shower.  Do not sign any important legal documents or operate any machinery for 24  hours (because of the anesthesia used during the test).  NUTRITION Drink plenty of fluids.  You may resume your normal diet.  Begin with a light meal and progress to your normal diet.  Avoid alcoholic beverages for 24 hours or as instructed by your caregiver.  MEDICATIONS You may resume your normal medications unless your caregiver tells you otherwise.  WHAT YOU CAN EXPECT TODAY You may experience abdominal discomfort such as a feeling of fullness or gas pains.   FOLLOW-UP Your doctor will discuss the results of your test with you.  SEEK IMMEDIATE MEDICAL ATTENTION IF ANY OF THE FOLLOWING OCCUR: Excessive nausea (feeling sick to your stomach) and/or vomiting.  Severe abdominal pain and distention (swelling).  Trouble swallowing.  Temperature over 101 F (37.8 C).  Rectal bleeding or vomiting of blood.    Continue Protonix 40 mg daily  Contrast CT of the abdomen and pelvis to further evaluate generalized abdominal pain.  Further recommendations to follow pending review of pathology report  Office visit with us in 4-6 weeks

## 2014-10-02 NOTE — H&P (View-Only) (Signed)
Primary Care Physician:  [facility] Primary Gastroenterologist:  Dr. Gala Romney   Chief Complaint  Patient presents with  . Abdominal Pain  . Diarrhea    HPI:   Evelyn Allen is a 49 y.o. female presenting today at the request of Alanda Amass, [place], secondary to chronic abdominal pain and diarrhea. She is here with her caregiver, who she calls "Momma". History of schizophrenia and mentally challenged. However, she is able to give a limited history.   Abdominal pain located at navel, intermittent for approximately 2 years.  Pain worsened after eating. No N/V. Caregiver states back and forth to the bathroom "all day long". Possible low-volume hematochezia. No prior colonoscopy.   Points to cervical and mid esophagus complaining of pain all the time. Food gets "hung" sometimes.Heartburn/indigestion at times. Prilosec 40 mg each day and Zantac BID. Possibly 3 lbs weight loss in past month. Good appetite.   Resides at [facility] care. Reportedly ultrasound of abdomen normal per PCP notes, but this is not available at time of visit. Labs listed below, unimpressive.    Past Medical History  Diagnosis Date  . GERD (gastroesophageal reflux disease)   . Chronic diarrhea   . Mentally challenged   . Schizophrenia     Past Surgical History  Procedure Laterality Date  . None      Current Outpatient Prescriptions  Medication Sig Dispense Refill  . acetaminophen (TYLENOL) 500 MG tablet Take 500 mg by mouth every 6 (six) hours as needed.    Marland Kitchen albuterol (PROVENTIL HFA;VENTOLIN HFA) 108 (90 BASE) MCG/ACT inhaler Inhale into the lungs every 6 (six) hours as needed for wheezing or shortness of breath.    . ARIPiprazole (ABILIFY) 30 MG tablet Take 30 mg by mouth daily.    . carbamazepine (TEGRETOL) 200 MG tablet Take 200 mg by mouth 2 (two) times daily.    . cetirizine (ZYRTEC) 10 MG tablet Take 10 mg by mouth daily.    . fluticasone (VERAMYST) 27.5  MCG/SPRAY nasal spray Place 2 sprays into the nose daily.    . Fluticasone-Salmeterol (ADVAIR) 250-50 MCG/DOSE AEPB Inhale 1 puff into the lungs 2 (two) times daily.    Marland Kitchen gabapentin (NEURONTIN) 400 MG capsule Take 400 mg by mouth 3 (three) times daily.    Marland Kitchen guanFACINE (TENEX) 1 MG tablet Take 1 mg by mouth at bedtime. 4 tablets at bedtime    . Homeopathic Products [facility] ALLERGY EYE RELIEF OP) Apply to eye. As directed    . Melatonin 3 MG CAPS Take by mouth at bedtime.    . naproxen (NAPROSYN) 500 MG tablet Take 500 mg by mouth daily.    . NON FORMULARY Calcium 500 plus Vit D 400   bid    . omeprazole (PRILOSEC) 40 MG capsule Take 40 mg by mouth daily.    . prazosin (MINIPRESS) 1 MG capsule Take 1 mg by mouth at bedtime.    . ranitidine (ZANTAC) 150 MG capsule Take 150 mg by mouth 2 (two) times daily.    . traZODone (DESYREL) 100 MG tablet Take 100 mg by mouth at bedtime. Two tablets at bedtime     No current facility-administered medications for this visit.    Allergies as of 09/17/2014 - Review Complete 09/17/2014  Allergen Reaction Noted  . Penicillins Itching and Rash 09/17/2014    Family History  Problem Relation Age of Onset  . Colon cancer      unknown  History   Social History  . Marital Status: Single    Spouse Name: N/A  . Number of Children: N/A  . Years of Education: N/A   Occupational History  . Not on file.   Social History Main Topics  . Smoking status: Never Smoker   . Smokeless tobacco: Not on file     Comment: Never smoked  . Alcohol Use: No  . Drug Use: No  . Sexual Activity: Not on file   Other Topics Concern  . Not on file   Social History Narrative  . No narrative on file    Review of Systems: Gen: Denies any fever, chills, fatigue, weight loss, lack of appetite.  CV: +palpitations Resp: +DOE GI: see HPI GU : Denies urinary burning, urinary frequency, urinary hesitancy MS: Denies joint pain, muscle weakness, cramps, or limitation  of movement.  Derm: Denies rash, itching, dry skin Psych: Denies depression, anxiety, memory loss, and confusion Heme: Denies bruising, bleeding, and enlarged lymph nodes.  Physical Exam: BP 117/77 mmHg  Pulse 73  Temp(Src) 97.8 F (36.6 C) (Oral)  Ht 5' (1.524 m)  Wt 176 lb 3.2 oz (79.924 kg)  BMI 34.41 kg/m2 General:   Alert and oriented. Flat affect but responds appropriately to questions.  Head:  Normocephalic and atraumatic. Eyes:  Without icterus, sclera clear and conjunctiva pink.  Ears:  Normal auditory acuity. Nose:  No deformity, discharge,  or lesions. Mouth:  No deformity or lesions, oral mucosa pink.  Lungs:  Clear to auscultation bilaterally. No wheezes, rales, or rhonchi. No distress.  Heart:  S1, S2 present without murmurs appreciated.  Abdomen:  +BS, soft, mild TTP diffusely but moreso upper abdomen and non-distended. No HSM noted. No guarding or rebound. No masses appreciated.  Rectal:  Deferred  Msk:  Symmetrical without gross deformities. Normal posture. Extremities:  Without edema. Neurologic:  Alert and  oriented to person, situation Skin:  Intact without significant lesions or rashes. Psych:  Alert and cooperative. Normal mood and affect.  Outside labs April 2016:  Tbili 0.4, Alk Phos 134, AST 16, ALT 12, Lipase 28, GGT normal at 54, Hgb 14.3, Hct 45.4, Platelets 151

## 2014-10-02 NOTE — Interval H&P Note (Signed)
History and Physical Interval Note:  10/02/2014 7:53 AM  Evelyn Allen  has presented today for surgery, with the diagnosis of diarrhea  The various methods of treatment have been discussed with the patient and family. After consideration of risks, benefits and other options for treatment, the patient has consented to  Procedure(s) with comments: COLONOSCOPY WITH PROPOFOL (N/A) - 0800 ESOPHAGOGASTRODUODENOSCOPY (EGD) WITH PROPOFOL (N/A) ESOPHAGEAL DILATION (N/A) as a surgical intervention .  The patient's history has been reviewed, patient examined, no change in status, stable for surgery.  I have reviewed the patient's chart and labs.  Questions were answered to the patient's satisfaction.     Evelyn Allen  No change. Protonix did not help. Developed more abdominal pain with Virbezi-it was stopped.  Plan for EGD with esophageal dilation as feasible/appropriate and screening colonoscopy today per plan.The risks, benefits, limitations, imponderables and alternatives regarding both EGD and colonoscopy have been reviewed with the patient. Questions have been answered. All parties agreeable.

## 2014-10-02 NOTE — Op Note (Signed)
[facility] [address] [place], [id number]   ENDOSCOPY PROCEDURE REPORT  PATIENT: Evelyn Allen, Evelyn Allen  MR#: #[id number] BIRTHDATE: 08/16/1965 , 49  yrs. old GENDER: female ENDOSCOPIST: R.  Roetta SessionsMichael Dashel Goines, MD Caleen EssexFACP FACG REFERRED BY:  [facility] PROCEDURE DATE:  10/02/2014 PROCEDURE:  EGD, diagnostic and Maloney dilation of esophagus INDICATIONS:  Esophageal dysphagia; generalized abdominal pain.Marland Kitchen. MEDICATIONS: Deep sedation per Dr.  [facility] ASA CLASS:      Class III  CONSENT: The risks, benefits, limitations, alternatives and imponderables have been discussed.  The potential for biopsy, esophogeal dilation, etc. have also been reviewed.  Questions have been answered.  All parties agreeable.  Please see the history and physical in the medical record for more information.  DESCRIPTION OF PROCEDURE: After the risks benefits and alternatives of the procedure were thoroughly explained, informed consent was obtained.  The    endoscope was introduced through the mouth and advanced to the second portion of the duodenum , limited by Without limitations. The instrument was slowly withdrawn as the mucosa was fully examined.    Normal-appearing, patent tubular esophagus.  Stomach empty. Normal-appearing gastric mucosa.  Patent pylorus.  Normal-appearing first and second portion of the duodenum.  A 54 French Maloney dilators passed a full insertion easily with mild resistance.  A look back revealed no apparent complication related to this maneuver.  Retroflexed views revealed no abnormalities.     The scope was then withdrawn from the patient and the procedure completed.  COMPLICATIONS: There were no immediate complications.  ENDOSCOPIC IMPRESSION: Normal EGD?"status post passage of a Maloney dilator. No endoscopic explanation for patient's abdominal pain  RECOMMENDATIONS: Continue Protonix 40 mg daily. Contrast CT of the abdomen  felt pelvis to further evaluate abdominal pain. See colonoscopy report.  REPEAT EXAM:  eSigned:  R. Roetta SessionsMichael Laksh Hinners, MD Jerrel IvoryFACP [facility] LLCFACG 10/02/2014 8:46 AM    CC:  CPT CODES: ICD CODES:  The ICD and CPT codes recommended by this software are interpretations from the data that the clinical staff has captured with the software.  The verification of the translation of this report to the ICD and CPT codes and modifiers is the sole responsibility of the health care institution and practicing physician where this report was generated.  [employer] will not be held responsible for the validity of the ICD and CPT codes included on this report.  AMA assumes no liability for data contained or not contained herein. CPT is a Publishing rights managerregistered trademark of the [employer].  PATIENT NAME:  Evelyn Allen, Evelyn Allen MR#: #[id number]

## 2014-10-02 NOTE — Transfer of Care (Signed)
Immediate Anesthesia Transfer of Care Note  Patient: Blanchard Maneatricia D Jaskowiak  Procedure(s) Performed: Procedure(s) with comments: COLONOSCOPY WITH PROPOFOL (N/A) - Cecum time in 0830   time out   0837  total time 7 minutes procedure 2 ESOPHAGOGASTRODUODENOSCOPY (EGD) WITH PROPOFOL (N/A) - procedure 1 ESOPHAGEAL DILATION (N/A) - [name] BIOPSY (N/A) - Ascending/Descending/Sigmoid  Patient Location: PACU  Anesthesia Type:MAC  Level of Consciousness: awake and patient cooperative  Airway & Oxygen Therapy: Patient Spontanous Breathing and Patient connected to face mask oxygen  Post-op Assessment: Report given to RN, Post -op Vital signs reviewed and stable and Patient moving all extremities  Post vital signs: Reviewed and stable  Last Vitals:  Filed Vitals:   10/02/14 0755  BP: 119/64  Pulse:   Temp:   Resp: 19    Complications: No apparent anesthesia complications

## 2014-10-02 NOTE — Anesthesia Preprocedure Evaluation (Signed)
Anesthesia Evaluation  Patient identified by MRN, date of birth, ID band Patient awake    Reviewed: Allergy & Precautions, NPO status , Patient's Chart, lab work & pertinent test results  Airway Mallampati: I  TM Distance: >3 FB     Dental  (+) Teeth Intact   Pulmonary asthma ,  breath sounds clear to auscultation        Cardiovascular negative cardio ROS  Rhythm:Regular Rate:Normal     Neuro/Psych PSYCHIATRIC DISORDERS (mental retardation) Schizophrenia    GI/Hepatic GERD-  Medicated,  Endo/Other    Renal/GU      Musculoskeletal   Abdominal   Peds  Hematology   Anesthesia Other Findings   Reproductive/Obstetrics                             Anesthesia Physical Anesthesia Plan  ASA: II  Anesthesia Plan: MAC   Post-op Pain Management:    Induction:   Airway Management Planned: Simple Face Mask  Additional Equipment:   Intra-op Plan:   Post-operative Plan:   Informed Consent: I have reviewed the patients History and Physical, chart, labs and discussed the procedure including the risks, benefits and alternatives for the proposed anesthesia with the patient or authorized representative who has indicated his/her understanding and acceptance.     Plan Discussed with:   Anesthesia Plan Comments:         Anesthesia Quick Evaluation

## 2014-10-02 NOTE — Anesthesia Postprocedure Evaluation (Signed)
  Anesthesia Post-op Note  Patient: Evelyn Allen  Procedure(s) Performed: Procedure(s) with comments: COLONOSCOPY WITH PROPOFOL (N/A) - Cecum time in 0830   time out   0837  total time 7 minutes procedure 2 ESOPHAGOGASTRODUODENOSCOPY (EGD) WITH PROPOFOL (N/A) - procedure 1 ESOPHAGEAL DILATION (N/A) - [name] BIOPSY (N/A) - Ascending/Descending/Sigmoid  Patient Location: PACU  Anesthesia Type:MAC  Level of Consciousness: awake, alert , oriented and patient cooperative  Airway and Oxygen Therapy: Patient Spontanous Breathing  Post-op Pain: none  Post-op Assessment: Post-op Vital signs reviewed, Patient's Cardiovascular Status Stable, Respiratory Function Stable and Patent Airway  Post-op Vital Signs: Reviewed and stable  Last Vitals:  Filed Vitals:   10/02/14 0755  BP: 119/64  Pulse:   Temp:   Resp: 19    Complications: No apparent anesthesia complications

## 2014-10-03 ENCOUNTER — Encounter (HOSPITAL_COMMUNITY): Payer: Self-pay | Admitting: Internal Medicine

## 2014-10-04 ENCOUNTER — Encounter: Payer: Self-pay | Admitting: Internal Medicine

## 2014-10-10 ENCOUNTER — Ambulatory Visit (HOSPITAL_COMMUNITY)
Admission: RE | Admit: 2014-10-10 | Discharge: 2014-10-10 | Disposition: A | Discharge status: Home / Self Care | Payer: Medicaid Other | Source: Ambulatory Visit | Attending: Internal Medicine | Admitting: Internal Medicine

## 2014-10-10 DIAGNOSIS — R197 Diarrhea, unspecified: Secondary | ICD-10-CM | POA: Diagnosis not present

## 2014-10-10 DIAGNOSIS — R109 Unspecified abdominal pain: Secondary | ICD-10-CM | POA: Insufficient documentation

## 2014-10-10 MED ORDER — IOHEXOL 300 MG/ML  SOLN
100.0000 mL | Freq: Once | INTRAMUSCULAR | Status: AC | PRN
  Administered 2014-10-10: 100 mL via INTRAVENOUS

## 2014-10-27 NOTE — Progress Notes (Signed)
Pt has appt 11/06/14 with Gerrit HallsAnna Sams

## 2014-11-06 ENCOUNTER — Other Ambulatory Visit: Payer: Self-pay

## 2014-11-06 ENCOUNTER — Ambulatory Visit (INDEPENDENT_AMBULATORY_CARE_PROVIDER_SITE_OTHER): Payer: Medicaid Other | Admitting: Gastroenterology

## 2014-11-06 ENCOUNTER — Encounter: Payer: Self-pay | Admitting: Gastroenterology

## 2014-11-06 VITALS — BP 142/88 | HR 79 | Temp 97.9°F | Ht 60.0 in | Wt 177.8 lb

## 2014-11-06 DIAGNOSIS — R1314 Dysphagia, pharyngoesophageal phase: Secondary | ICD-10-CM

## 2014-11-06 DIAGNOSIS — R109 Unspecified abdominal pain: Secondary | ICD-10-CM | POA: Diagnosis not present

## 2014-11-06 NOTE — Patient Instructions (Signed)
I have ordered further tests of your gallbladder and esophagus.  Further recommendations to follow!

## 2014-11-06 NOTE — Progress Notes (Signed)
Referring Provider: Biagio Quint* Primary Care Physician:  Lucius Conn  Chief Complaint  Patient presents with  . Follow-up    hospital    HPI:   Evelyn Allen is a 49 y.o. female presenting today with a history of chronic abdominal pain and diarrhea. History of schizophrenia and mentally challenged. Vague dysphagia history. Recently underwent colonoscopy (normal), and EGD with empiric dilation. CT unrevealing for abdominal pain. US abdomen in April 2016 Negative.   No diarrhea. No Viberzi, as it made her have abdominal pain. Protonix daily. BM usually every day. Notes umbilical discomfort, states it is constant. Good appetite. States pain with eating. Ate a large amount of junk food on Sunday and threw up. States throat is always sore. Empiric dilation completed. WEIGHTS STABLE.    Past Medical History  Diagnosis Date  . GERD (gastroesophageal reflux disease)   . Chronic diarrhea   . Mentally challenged   . Schizophrenia   . Asthma     Past Surgical History  Procedure Laterality Date  . None    . Colonoscopy with propofol N/A 10/02/2014    RMR: normal  . Esophagogastroduodenoscopy (egd) with propofol N/A 10/02/2014    RMR: normal s/p dilator  . Esophageal dilation N/A 10/02/2014    Procedure: ESOPHAGEAL DILATION;  Surgeon: Corbin Ade, MD;  Location: [facility];  Service: Endoscopy;  Laterality: N/A;  Maloney 54  . Esophageal biopsy N/A 10/02/2014    Procedure: BIOPSY;  Surgeon: Corbin Ade, MD;  Location: [facility];  Service: Endoscopy;  Laterality: N/A;  Ascending/Descending/Sigmoid    Current Outpatient Prescriptions  Medication Sig Dispense Refill  . acetaminophen (TYLENOL) 500 MG tablet Take 500 mg by mouth every 6 (six) hours as needed for mild pain.     Marland Kitchen albuterol (PROVENTIL HFA;VENTOLIN HFA) 108 (90 BASE) MCG/ACT inhaler Inhale 1 puff into the lungs every 6 (six) hours as needed for wheezing or shortness of breath.     . ARIPiprazole  (ABILIFY) 15 MG tablet Take 15 mg by mouth 2 (two) times daily.    . Calcium Carbonate-Vitamin D (CALCIUM + D PO) Take 1 capsule by mouth 2 (two) times daily.    . carbamazepine (TEGRETOL) 200 MG tablet Take 200 mg by mouth 2 (two) times daily.    . cetirizine (ZYRTEC) 10 MG tablet Take 10 mg by mouth daily.    . fluticasone (VERAMYST) 27.5 MCG/SPRAY nasal spray Place 2 sprays into the nose daily.    . Fluticasone-Salmeterol (ADVAIR) 250-50 MCG/DOSE AEPB Inhale 2 puffs into the lungs 2 (two) times daily.     Marland Kitchen gabapentin (NEURONTIN) 400 MG capsule Take 400 mg by mouth 3 (three) times daily.    Marland Kitchen guanFACINE (TENEX) 1 MG tablet Take 2 mg by mouth 2 (two) times daily.     . Homeopathic Products [facility] ALLERGY EYE RELIEF OP) Apply 2-3 drops to eye daily. As directed    . Melatonin 3 MG CAPS Take 1 capsule by mouth at bedtime.     . naproxen (NAPROSYN) 500 MG tablet Take 500 mg by mouth daily.    . pantoprazole (PROTONIX) 40 MG tablet Take 1 tablet (40 mg total) by mouth daily. 90 tablet 3  . polyethylene glycol (MIRALAX / GLYCOLAX) packet Take 17 g by mouth 2 (two) times daily as needed for mild constipation.    . prazosin (MINIPRESS) 1 MG capsule Take 1 mg by mouth at bedtime.    Marland Kitchen  ranitidine (ZANTAC) 150 MG capsule Take 150 mg by mouth 2 (two) times daily.    . traZODone (DESYREL) 100 MG tablet Take 100 mg by mouth at bedtime. Two tablets at bedtime    . polyethylene glycol-electrolytes (NULYTELY/GOLYTELY) 420 G solution Take 4,000 mLs by mouth once. (Patient not taking: Reported on 11/06/2014) 4000 mL 0   No current facility-administered medications for this visit.    Allergies as of 11/06/2014 - Review Complete 11/06/2014  Allergen Reaction Noted  . Penicillins Itching and Rash 09/17/2014    Family History  Problem Relation Age of Onset  . Colon cancer      unknown     History   Social History  . Marital Status: Single    Spouse Name: N/A  . Number of Children: N/A  . Years of  Education: N/A   Social History Main Topics  . Smoking status: Never Smoker   . Smokeless tobacco: Not on file     Comment: Never smoked  . Alcohol Use: No  . Drug Use: No  . Sexual Activity: No   Other Topics Concern  . None   Social History Narrative    Review of Systems: As mentioned in HPI  Physical Exam: BP 142/88 mmHg  Pulse 79  Temp(Src) 97.9 F (36.6 C)  Ht 5' (1.524 m)  Wt 177 lb 12.8 oz (80.65 kg)  BMI 34.72 kg/m2 General:   Alert and oriented. No distress noted. Pleasant and cooperative.  Head:  Normocephalic and atraumatic. Eyes:  Conjuctiva clear without scleral icterus. Abdomen:  +BS, soft, non-tender and non-distended. No rebound or guarding. No HSM or masses noted. Msk:  Symmetrical without gross deformities. Normal posture. Extremities:  Without edema. Neurologic:  Alert and  oriented x4;  grossly normal neurologically. Psych:  Alert and cooperative. Normal mood and affect.  Lab Results  Component Value Date   WBC 8.6 09/30/2014   HGB 14.1 09/30/2014   HCT 42.9 09/30/2014   MCV 92.5 09/30/2014   PLT 168 09/30/2014

## 2014-11-13 ENCOUNTER — Encounter (HOSPITAL_COMMUNITY)
Admission: RE | Admit: 2014-11-13 | Discharge: 2014-11-13 | Disposition: A | Discharge status: Home / Self Care | Payer: Medicaid Other | Source: Ambulatory Visit | Attending: Gastroenterology | Admitting: Gastroenterology

## 2014-11-13 ENCOUNTER — Encounter (HOSPITAL_COMMUNITY): Payer: Self-pay

## 2014-11-13 ENCOUNTER — Ambulatory Visit (HOSPITAL_COMMUNITY)
Admission: RE | Admit: 2014-11-13 | Discharge: 2014-11-13 | Disposition: A | Discharge status: Home / Self Care | Payer: Medicaid Other | Source: Ambulatory Visit | Attending: Gastroenterology | Admitting: Gastroenterology

## 2014-11-13 DIAGNOSIS — R1314 Dysphagia, pharyngoesophageal phase: Secondary | ICD-10-CM | POA: Insufficient documentation

## 2014-11-13 DIAGNOSIS — R110 Nausea: Secondary | ICD-10-CM | POA: Insufficient documentation

## 2014-11-13 DIAGNOSIS — R109 Unspecified abdominal pain: Secondary | ICD-10-CM | POA: Insufficient documentation

## 2014-11-13 DIAGNOSIS — R1310 Dysphagia, unspecified: Secondary | ICD-10-CM | POA: Diagnosis present

## 2014-11-13 MED ORDER — SINCALIDE 5 MCG IJ SOLR
INTRAMUSCULAR | Status: AC
  Administered 2014-11-13: 1.64 ug via INTRAVENOUS
  Filled 2014-11-13: qty 5

## 2014-11-13 MED ORDER — TECHNETIUM TC 99M MEBROFENIN IV KIT
5.0000 | PACK | Freq: Once | INTRAVENOUS | Status: AC | PRN
  Administered 2014-11-13: 5 via INTRAVENOUS

## 2014-11-13 MED ORDER — STERILE WATER FOR INJECTION IJ SOLN
INTRAMUSCULAR | Status: AC
  Administered 2014-11-13: 5 mL via INTRAVENOUS
  Filled 2014-11-13: qty 10

## 2014-11-13 NOTE — Assessment & Plan Note (Signed)
49 year old mentally challenged, pleasant female with chronic abdominal pain and unrevealing EGD, US abdomen, CT. Gallbladder remains in situ, and biliary dyskinesia unable to be excluded. However, it is difficult to assess symptoms due to poor history from patient. As of note, diarrhea has resolved, appetite is good. Likely multifactorial etiology to abdominal pain. Weight is stable, which is encouraging.   Will proceed with HIDA Continue PPI Avoid trigger foods. Follow low fat diet

## 2014-11-13 NOTE — Assessment & Plan Note (Signed)
Vague, persistent dysphagia despite empiric dilation. Proceed with BPE.

## 2014-11-14 NOTE — Progress Notes (Signed)
CC'ED TO PCP 

## 2014-11-18 NOTE — Progress Notes (Signed)
Quick Note:  BPE and HIDA are both normal. I don't see any warning signs from patient. With being mentally challenged, it's difficult to get a history. I think we should closely monitor patient. We have ruled out biliary component to abdominal pain and structural component for dysphagia. Let's have her return in 6 weeks . ______

## 2014-11-24 NOTE — Progress Notes (Signed)
Quick Note:  I called and informed Evelyn Allen who is over the home where pt resides. She is aware that our office will contact her for the office visit appointment. I am mailing this note for their records. ______

## 2014-11-24 NOTE — Progress Notes (Signed)
APPOINTMENT MADE WITH CARETAKER AND IS AWARE OF TIME AND DAY

## 2015-01-07 ENCOUNTER — Ambulatory Visit (INDEPENDENT_AMBULATORY_CARE_PROVIDER_SITE_OTHER): Payer: Medicaid Other | Admitting: Gastroenterology

## 2015-01-07 ENCOUNTER — Encounter: Payer: Self-pay | Admitting: Gastroenterology

## 2015-01-07 VITALS — BP 123/76 | HR 102 | Temp 98.4°F | Ht 61.0 in | Wt 171.8 lb

## 2015-01-07 DIAGNOSIS — K529 Noninfective gastroenteritis and colitis, unspecified: Secondary | ICD-10-CM | POA: Diagnosis not present

## 2015-01-07 MED ORDER — DICYCLOMINE HCL 10 MG PO CAPS: 10.0000 mg | ORAL_CAPSULE | Freq: Three times a day (TID) | ORAL | Status: DC

## 2015-01-07 NOTE — Progress Notes (Signed)
CC'ED TO PCP 

## 2015-01-07 NOTE — Patient Instructions (Signed)
Take Bentyl 1 capsule before meals (three times a day). IF you have constipation, hold the dose.   For days of constipation, you may take a dose of Miralax each evening.   Let's see you in 4-6 weeks.

## 2015-01-07 NOTE — Progress Notes (Signed)
Referring Provider: Biagio Quint* Primary Care Physician:  Lucius Conn Primary GI: Dr. Jena Gauss   Chief Complaint  Patient presents with  . Follow-up    HPI:   Evelyn Allen is a 49 y.o. female presenting today with a history of chronic abdominal pain and diarrhea. History of schizophrenia and mentally challenged. Vague dysphagia history. Recently underwent colonoscopy (normal), and EGD with empiric dilation. CT unrevealing for abdominal pain. US abdomen in April 2016 Negative. Unable to tolerate Viberzi due to abdominal pain. At last visit noted constant umbilical discomfort but good appetite. HIDA completed due to persistent abdominal pain: normal with EF of 96%.   BPE completed due to dysphagia: normal. Weight about 6 lbs down from July 2016 last visit. Off and on intermittent abdominal discomfort associated with BMs. Will have diarrhea moreso than constipation. Caretaker present.   Past Medical History  Diagnosis Date  . GERD (gastroesophageal reflux disease)   . Chronic diarrhea   . Mentally challenged   . Schizophrenia   . Asthma     Past Surgical History  Procedure Laterality Date  . None    . Colonoscopy with propofol N/A 10/02/2014    RMR: normal  . Esophagogastroduodenoscopy (egd) with propofol N/A 10/02/2014    RMR: normal s/p dilator  . Esophageal dilation N/A 10/02/2014    Procedure: ESOPHAGEAL DILATION;  Surgeon: Corbin Ade, MD;  Location: [facility];  Service: Endoscopy;  Laterality: N/A;  [place] 55  . Esophageal biopsy N/A 10/02/2014    Procedure: BIOPSY;  Surgeon: Corbin Ade, MD;  Location: [facility];  Service: Endoscopy;  Laterality: N/A;  Ascending/Descending/Sigmoid    Current Outpatient Prescriptions  Medication Sig Dispense Refill  . acetaminophen (TYLENOL) 500 MG tablet Take 500 mg by mouth every 6 (six) hours as needed for mild pain.     Marland Kitchen albuterol (PROVENTIL HFA;VENTOLIN HFA) 108 (90 BASE) MCG/ACT inhaler Inhale 1 puff into the  lungs every 6 (six) hours as needed for wheezing or shortness of breath.     . ARIPiprazole (ABILIFY) 15 MG tablet Take 15 mg by mouth 2 (two) times daily.    . Calcium Carbonate-Vitamin D (CALCIUM + D PO) Take 1 capsule by mouth 2 (two) times daily.    . carbamazepine (TEGRETOL) 200 MG tablet Take 200 mg by mouth 2 (two) times daily.    . cetirizine (ZYRTEC) 10 MG tablet Take 10 mg by mouth daily.    . fluticasone (VERAMYST) 27.5 MCG/SPRAY nasal spray Place 2 sprays into the nose daily.    . Fluticasone-Salmeterol (ADVAIR) 250-50 MCG/DOSE AEPB Inhale 2 puffs into the lungs 2 (two) times daily.     Marland Kitchen gabapentin (NEURONTIN) 400 MG capsule Take 400 mg by mouth 3 (three) times daily.    Marland Kitchen guanFACINE (TENEX) 1 MG tablet Take 2 mg by mouth 2 (two) times daily.     . Homeopathic Products [facility] ALLERGY EYE RELIEF OP) Apply 2-3 drops to eye daily. As directed    . Melatonin 3 MG CAPS Take 1 capsule by mouth at bedtime.     . naproxen (NAPROSYN) 500 MG tablet Take 500 mg by mouth daily.    . pantoprazole (PROTONIX) 40 MG tablet Take 1 tablet (40 mg total) by mouth daily. 90 tablet 3  . polyethylene glycol (MIRALAX / GLYCOLAX) packet Take 17 g by mouth 2 (two) times daily as needed for mild constipation.    . prazosin (MINIPRESS) 1 MG capsule Take 1  mg by mouth at bedtime.    . ranitidine (ZANTAC) 150 MG capsule Take 150 mg by mouth 2 (two) times daily.    . traZODone (DESYREL) 100 MG tablet Take 100 mg by mouth at bedtime. Two tablets at bedtime     No current facility-administered medications for this visit.    Allergies as of 01/07/2015 - Review Complete 01/07/2015  Allergen Reaction Noted  . Penicillins Itching and Rash 09/17/2014    Family History  Problem Relation Age of Onset  . Colon cancer      unknown     Social History   Social History  . Marital Status: Single    Spouse Name: N/A  . Number of Children: N/A  . Years of Education: N/A   Social History Main Topics  .  Smoking status: Never Smoker   . Smokeless tobacco: None     Comment: Never smoked  . Alcohol Use: No  . Drug Use: No  . Sexual Activity: No   Other Topics Concern  . None   Social History Narrative    Review of Systems: Negative unless mentioned in HPI  Physical Exam: BP 123/76 mmHg  Pulse 102  Temp(Src) 98.4 F (36.9 C) (Oral)  Ht  (1.549 m)  Wt 171 lb 12.8 oz (77.928 kg)  BMI 32.48 kg/m2 General:   Alert and oriented. No distress noted. Pleasant and cooperative.  Head:  Normocephalic and atraumatic. Eyes:  Conjuctiva clear without scleral icterus. Abdomen:  +BS, soft, non-tender and non-distended. No rebound or guarding. No HSM or masses noted. Msk:  Symmetrical without gross deformities. Normal posture. Extremities:  Without edema. Neurologic:  Alert and  oriented x4;  grossly normal neurologically. Psych:  Alert and cooperative. Normal mood and affect.

## 2015-01-07 NOTE — Assessment & Plan Note (Signed)
49 year old with chronic abdominal discomfort, mixed pattern IBS but moreso diarrhea, with thorough work-up as noted in HPI. History of schizophrenia and mentally challenged have caused some hurdles in care; however, she seems to be doing well and needs more targeted therapy for likely IBS, diarrhea predominant. She has not done well with Viberzi in the past, having significant abdominal pain. Will trial Bentyl TID with meals, Miralax only if constipated. Return in 4-6 weeks.

## 2015-01-12 ENCOUNTER — Telehealth: Payer: Self-pay | Admitting: Internal Medicine

## 2015-01-12 NOTE — Telephone Encounter (Signed)
Spoke with her caregiver- pt has been having abd pain everytime she takes bentyl. She is not having any nausea or vomiting. She is taking it prior to meals. bm's are normal. Advised them to stop the medication until I get further recommendations from [facility]. They agreed.

## 2015-01-12 NOTE — Telephone Encounter (Signed)
Patient had seen [facility] recently and the new medication she is on is making her sick. Please advise. She uses [facility]. [phone]

## 2015-01-13 NOTE — Telephone Encounter (Signed)
pts caregiver is aware.

## 2015-01-13 NOTE — Telephone Encounter (Signed)
Agree. Let's see how she does off Bentyl and only taking Miralax as needed. If starts having diarrhea, I can call in Levsin.

## 2015-02-12 ENCOUNTER — Ambulatory Visit: Payer: Medicaid Other | Admitting: Gastroenterology

## 2015-09-22 ENCOUNTER — Other Ambulatory Visit: Payer: Self-pay

## 2015-09-23 MED ORDER — PANTOPRAZOLE SODIUM 40 MG PO TBEC: 40.0000 mg | DELAYED_RELEASE_TABLET | Freq: Every day | ORAL | Status: AC

## 2015-12-05 ENCOUNTER — Emergency Department (HOSPITAL_COMMUNITY): Payer: Medicaid Other

## 2015-12-05 ENCOUNTER — Encounter (HOSPITAL_COMMUNITY): Payer: Self-pay

## 2015-12-05 ENCOUNTER — Emergency Department (HOSPITAL_COMMUNITY)
Admission: EM | Admit: 2015-12-05 | Discharge: 2015-12-05 | Disposition: A | Discharge status: Home / Self Care | Payer: Medicaid Other | Attending: Emergency Medicine | Admitting: Emergency Medicine

## 2015-12-05 DIAGNOSIS — J45909 Unspecified asthma, uncomplicated: Secondary | ICD-10-CM | POA: Diagnosis not present

## 2015-12-05 DIAGNOSIS — R4781 Slurred speech: Secondary | ICD-10-CM | POA: Diagnosis not present

## 2015-12-05 DIAGNOSIS — Z79899 Other long term (current) drug therapy: Secondary | ICD-10-CM | POA: Diagnosis not present

## 2015-12-05 DIAGNOSIS — R079 Chest pain, unspecified: Secondary | ICD-10-CM | POA: Diagnosis present

## 2015-12-05 LAB — DIFFERENTIAL
BASOS ABS: 0 10*3/uL (ref 0.0–0.1)
BASOS PCT: 0 %
EOS ABS: 0 10*3/uL (ref 0.0–0.7)
EOS PCT: 0 %
Lymphocytes Relative: 32 %
Lymphs Abs: 2.2 10*3/uL (ref 0.7–4.0)
Monocytes Absolute: 0.9 10*3/uL (ref 0.1–1.0)
Monocytes Relative: 14 %
NEUTROS PCT: 54 %
Neutro Abs: 3.8 10*3/uL (ref 1.7–7.7)

## 2015-12-05 LAB — I-STAT CHEM 8, ED
BUN: 9 mg/dL (ref 6–20)
CALCIUM ION: 1.13 mmol/L (ref 1.13–1.30)
CHLORIDE: 106 mmol/L (ref 101–111)
Creatinine, Ser: 1 mg/dL (ref 0.44–1.00)
Glucose, Bld: 102 mg/dL — ABNORMAL HIGH (ref 65–99)
HEMATOCRIT: 40 % (ref 36.0–46.0)
Hemoglobin: 13.6 g/dL (ref 12.0–15.0)
Potassium: 4.2 mmol/L (ref 3.5–5.1)
SODIUM: 140 mmol/L (ref 135–145)
TCO2: 23 mmol/L (ref 0–100)

## 2015-12-05 LAB — CBC
HCT: 39.8 % (ref 36.0–46.0)
Hemoglobin: 13.5 g/dL (ref 12.0–15.0)
MCH: 30.6 pg (ref 26.0–34.0)
MCHC: 33.9 g/dL (ref 30.0–36.0)
MCV: 90.2 fL (ref 78.0–100.0)
PLATELETS: 162 10*3/uL (ref 150–400)
RBC: 4.41 MIL/uL (ref 3.87–5.11)
RDW: 12.3 % (ref 11.5–15.5)
WBC: 6.9 10*3/uL (ref 4.0–10.5)

## 2015-12-05 LAB — COMPREHENSIVE METABOLIC PANEL
ALBUMIN: 4.1 g/dL (ref 3.5–5.0)
ALT: 13 U/L — ABNORMAL LOW (ref 14–54)
ANION GAP: 5 (ref 5–15)
AST: 18 U/L (ref 15–41)
Alkaline Phosphatase: 117 U/L (ref 38–126)
BUN: 10 mg/dL (ref 6–20)
CHLORIDE: 108 mmol/L (ref 101–111)
CO2: 22 mmol/L (ref 22–32)
Calcium: 8.9 mg/dL (ref 8.9–10.3)
Creatinine, Ser: 0.95 mg/dL (ref 0.44–1.00)
GFR calc Af Amer: >60 mL/min (ref >60)
GFR calc non Af Amer: >60 mL/min (ref >60)
GLUCOSE: 115 mg/dL — AB (ref 65–99)
POTASSIUM: 3.4 mmol/L — AB (ref 3.5–5.1)
Sodium: 135 mmol/L (ref 135–145)
Total Bilirubin: 0.3 mg/dL (ref 0.3–1.2)
Total Protein: 7.1 g/dL (ref 6.5–8.1)

## 2015-12-05 LAB — PROTIME-INR
INR: 0.99
PROTHROMBIN TIME: 13.1 s (ref 11.4–15.2)

## 2015-12-05 LAB — I-STAT TROPONIN, ED: TROPONIN I, POC: 0 ng/mL (ref 0.00–0.08)

## 2015-12-05 LAB — CBG MONITORING, ED: GLUCOSE-CAPILLARY: 92 mg/dL (ref 65–99)

## 2015-12-05 LAB — APTT: APTT: 32 s (ref 24–36)

## 2015-12-05 NOTE — ED Triage Notes (Signed)
Caretaker reports pain had sudden onset of slurred speech at 0915 this morning. Patient reports of blurred vision. No weakness noted. Patient complains of chest pain since last night.

## 2015-12-05 NOTE — ED Notes (Signed)
Triage nurse unable to find a doctor at this time.

## 2015-12-05 NOTE — Discharge Instructions (Signed)

## 2015-12-05 NOTE — ED Notes (Signed)
Caregiver verbalizes that pt is now at baseline for speech.

## 2015-12-05 NOTE — ED Notes (Signed)
Nurse is having difficulty getting blood back from IV site.   First IV attempt by Hyacinth Meeker was unsuccessful. Second attempt by Donalda Ewings was successful. R-Hand IV successful.

## 2015-12-05 NOTE — ED Notes (Signed)
MD at bedside. 

## 2015-12-05 NOTE — ED Notes (Signed)
Delay in neurologist consult. Another pt is currently being evaluated by [facility]; machine unavailable.

## 2015-12-05 NOTE — ED Notes (Signed)
Pt denies any chest pain at this time 

## 2015-12-05 NOTE — Progress Notes (Signed)
Code called @ 12:55 Pt on table at 12:56 Beeper @ 12:56 Images to [facility] @ 13:07 [facility] called @ 13:05  Please note : 2 code strokes called with a few minutes of each other./bbj

## 2015-12-05 NOTE — ED Notes (Signed)
[facility] Neurologist evaluated pt at this time.

## 2015-12-05 NOTE — ED Provider Notes (Signed)
[facility] Provider Note   CSN: [id number] Arrival date & time: 12/05/15  1219  First Provider Contact:   First MD Initiated Contact with Patient 12/05/15 1243     By signing my name below, I, Evelyn Allen, attest that this documentation has been prepared under the direction and in the presence of Eber Hong, MD . Electronically Signed: Levon Allen, Scribe. 12/05/2015. 1:58 PM.  History   Chief Complaint Chief Complaint  Patient presents with  . Aphasia  . Chest Pain    HPI Evelyn Allen is a mentally challenged 50 y.o. female who presents to the Emergency Department complaining of chest pain onset last night. No alleviating or modifying factors noted.  Pt's caretaker also notes associated sudden onset blurred vision, slurred speech and fatigue which began this morning at 10 am after returning from physical therapy. Caretaker states her speech is better, but still "a little off". Pt states her vision has returned to normal. She has a disconjugate gaze which is at her baseline.  Pt was last seen normal at 9 this am. Pt has no history of DM, HTN, or HLD. She is on no new medications.    The history is provided by the patient and a caregiver. No language interpreter was used.    Past Medical History:  Diagnosis Date  . Asthma   . Chronic diarrhea   . GERD (gastroesophageal reflux disease)   . Mentally challenged   . Schizophrenia [facility])     Patient Active Problem List   Diagnosis Date Noted  . Chronic diarrhea   . Diverticulosis of colon without hemorrhage   . Diarrhea 09/17/2014  . Abdominal pain 09/17/2014  . Dysphagia, pharyngoesophageal phase 09/17/2014    Past Surgical History:  Procedure Laterality Date  . BIOPSY N/A 10/02/2014   Procedure: BIOPSY;  Surgeon: Corbin Ade, MD;  Location: [facility];  Service: Endoscopy;  Laterality: N/A;  Ascending/Descending/Sigmoid  . COLONOSCOPY WITH PROPOFOL N/A 10/02/2014   RMR: normal  . ESOPHAGEAL DILATION N/A 10/02/2014    Procedure: ESOPHAGEAL DILATION;  Surgeon: Corbin Ade, MD;  Location: [facility];  Service: Endoscopy;  Laterality: N/A;  [place] 55  . ESOPHAGOGASTRODUODENOSCOPY (EGD) WITH PROPOFOL N/A 10/02/2014   RMR: normal s/p dilator  . None      OB History    No data available       Home Medications    Prior to Admission medications   Medication Sig Start Date End Date Taking? Authorizing Provider  acetaminophen (TYLENOL) 500 MG tablet Take 500 mg by mouth every 6 (six) hours as needed for mild pain.     Historical Provider, MD  albuterol (PROVENTIL HFA;VENTOLIN HFA) 108 (90 BASE) MCG/ACT inhaler Inhale 1 puff into the lungs every 6 (six) hours as needed for wheezing or shortness of breath.     Historical Provider, MD  ARIPiprazole (ABILIFY) 15 MG tablet Take 15 mg by mouth 2 (two) times daily.    Historical Provider, MD  Calcium Carbonate-Vitamin D (CALCIUM + D PO) Take 1 capsule by mouth 2 (two) times daily.    Historical Provider, MD  carbamazepine (TEGRETOL) 200 MG tablet Take 200 mg by mouth 2 (two) times daily.    Historical Provider, MD  cetirizine (ZYRTEC) 10 MG tablet Take 10 mg by mouth daily.    Historical Provider, MD  dicyclomine (BENTYL) 10 MG capsule Take 1 capsule (10 mg total) by mouth 3 (three) times daily before meals. HOLD DOSE IF CONSTIPATION. 01/07/15  Nira Retort, NP  fluticasone (VERAMYST) 27.5 MCG/SPRAY nasal spray Place 2 sprays into the nose daily.    Historical Provider, MD  Fluticasone-Salmeterol (ADVAIR) 250-50 MCG/DOSE AEPB Inhale 2 puffs into the lungs 2 (two) times daily.     Historical Provider, MD  gabapentin (NEURONTIN) 400 MG capsule Take 400 mg by mouth 3 (three) times daily.    Historical Provider, MD  guanFACINE (TENEX) 1 MG tablet Take 2 mg by mouth 2 (two) times daily.     Historical Provider, MD  Homeopathic Products [facility] ALLERGY EYE RELIEF OP) Apply 2-3 drops to eye daily. As directed    Historical Provider, MD  Melatonin 3 MG CAPS Take 1 capsule by  mouth at bedtime.     Historical Provider, MD  naproxen (NAPROSYN) 500 MG tablet Take 500 mg by mouth daily.    Historical Provider, MD  pantoprazole (PROTONIX) 40 MG tablet Take 1 tablet (40 mg total) by mouth daily. 09/23/15   Nira Retort, NP  polyethylene glycol [facility] / Ethelene Hal) packet Take 17 g by mouth 2 (two) times daily as needed for mild constipation.    Historical Provider, MD  prazosin (MINIPRESS) 1 MG capsule Take 1 mg by mouth at bedtime.    Historical Provider, MD  ranitidine (ZANTAC) 150 MG capsule Take 150 mg by mouth 2 (two) times daily.    Historical Provider, MD  traZODone (DESYREL) 100 MG tablet Take 100 mg by mouth at bedtime. Two tablets at bedtime    Historical Provider, MD    Family History Family History  Problem Relation Age of Onset  . Colon cancer      unknown     Social History Social History  Substance Use Topics  . Smoking status: Never Smoker  . Smokeless tobacco: Never Used     Comment: Never smoked  . Alcohol use No     Allergies   Penicillins   Review of Systems Review of Systems  Constitutional: Positive for fatigue.  Eyes: Positive for visual disturbance.  Cardiovascular: Positive for chest pain.  Neurological: Positive for speech difficulty.  All other systems reviewed and are negative.    Physical Exam Updated Vital Signs BP 145/99 (BP Location: Left Arm)   Pulse 104   Temp 98.3 F (36.8 C) (Oral)   Resp 18   SpO2 100%   Physical Exam  Constitutional: She is oriented to person, place, and time. She appears well-developed and well-nourished. No distress.  HENT:  Head: Normocephalic and atraumatic.  Eyes: EOM are normal.  Neck: Normal range of motion.  Cardiovascular: Normal rate, regular rhythm and normal heart sounds.   Pulmonary/Chest: Effort normal and breath sounds normal.  Abdominal: Soft. She exhibits no distension. There is no tenderness.  Musculoskeletal: Normal range of motion.  Neurological: She is alert and  oriented to person, place, and time.  The patient has baseline speech and vision according to the patient and her caregiver at this time, they state that she sounds pretty much like normal but not quite back to normal. I do not elicit any definite expressive aphasia or slurred speech. She has normal strength in all 4 extremities, normal sensation in all 4 extremities, normal coordination, no facial droop, cranial nerves III through XII are normal, she does have a disconjugate gaze which is normal   Skin: Skin is warm and dry.  Psychiatric: She has a normal mood and affect. Judgment normal.  Nursing note and vitals reviewed.    ED Treatments /  Results  DIAGNOSTIC STUDIES:  Oxygen Saturation is 100% on RA, normal by my interpretation.    COORDINATION OF CARE:  12:50 PM Discussed treatment plan with pt and caretaker at bedside and pt agreed to plan.  Labs (all labs ordered are listed, but only abnormal results are displayed) Labs Reviewed  PROTIME-INR  APTT  CBC  DIFFERENTIAL  COMPREHENSIVE METABOLIC PANEL  I-STAT TROPOININ, ED  I-STAT CHEM 8, ED  CBG MONITORING, ED    EKG  EKG Interpretation None       Radiology No results found.  Procedures Procedures (including critical care time)  Medications Ordered in ED Medications - No data to display   Initial Impression / Assessment and Plan / ED Course  I have reviewed the triage vital signs and the nursing notes.  Pertinent labs & imaging results that were available during my care of the patient were reviewed by me and considered in my medical decision making (see chart for details).  Clinical Course  Comment By Time  The patient has some abnormal symptoms this morning with blurred vision and slurred speech though at this time her symptoms are back to normal. Due to her underlying mental retardation is difficult to tell whether she has truly any neurologic abnormalities but according to the caregiver who was in the room  with her at this time she states that her level of alertness is normal and her speech is "close to normal". I do not detect any obvious slurred speech or expressive aphasia. Her muscular skeletal exam is normal and her neurologic exam is nonfocal. Eber Hong, MD 08/05 1303  CT negative - informed at 1:41 PM by radiologist - pt still has what appears to be normal voice Eber Hong, MD 08/05 1341  Discussed care with [facility], no evidence of stroke, he recommends d/c home if nothing else comes up abnormal - from a CP side of things, she is not having any pain and ECG and labs unremarkable including trop, BMP and CBC.   Eber Hong, MD 08/05 1345   Discussed with family who will let pt go  Final Clinical Impressions(s) / ED Diagnoses   Final diagnoses:  None   I personally performed the services described in this documentation, which was scribed in my presence. The recorded information has been reviewed and is accurate.    New Prescriptions New Prescriptions   No medications on file     Eber Hong, MD 12/05/15 1409

## 2015-12-05 NOTE — ED Notes (Signed)
Pt at CT.    Delay in CT related to another code stroke gettting CT.

## 2015-12-05 NOTE — ED Notes (Signed)
Pt is now stating that she can see and vision is at baseline.

## 2015-12-05 NOTE — ED Notes (Signed)
SOC machine is now at bedside.

## 2017-02-24 ENCOUNTER — Encounter: Payer: Self-pay | Admitting: *Deleted

## 2017-09-01 ENCOUNTER — Other Ambulatory Visit: Payer: Self-pay

## 2017-09-01 ENCOUNTER — Ambulatory Visit (HOSPITAL_COMMUNITY)
Admission: EM | Admit: 2017-09-01 | Discharge: 2017-09-01 | Disposition: A | Discharge status: Home / Self Care | Payer: Medicaid Other | Attending: Family Medicine | Admitting: Family Medicine

## 2017-09-01 ENCOUNTER — Encounter (HOSPITAL_COMMUNITY): Payer: Self-pay

## 2017-09-01 DIAGNOSIS — W101XXA Fall (on)(from) sidewalk curb, initial encounter: Secondary | ICD-10-CM | POA: Diagnosis not present

## 2017-09-01 DIAGNOSIS — Z043 Encounter for examination and observation following other accident: Secondary | ICD-10-CM

## 2017-09-01 DIAGNOSIS — W19XXXA Unspecified fall, initial encounter: Secondary | ICD-10-CM

## 2017-09-01 NOTE — Discharge Instructions (Signed)
You have been checked out and I see no sign of injury Return as needed

## 2017-09-01 NOTE — ED Notes (Signed)
Pt discharged by provider.

## 2017-09-01 NOTE — ED Triage Notes (Signed)
Pt presents today [place] the [place], states she missed a step and possibly hit her head.

## 2017-09-01 NOTE — ED Provider Notes (Signed)
[facility]    CSN: [id number] Arrival date & time: 09/01/17  1634     History   Chief Complaint Chief Complaint  Patient presents with  . Fall    HPI Evelyn Allen is a 52 y.o. female.   HPI  Patient is a mentally impaired/challenged individual who is cared for in the home by caregivers.  Today while at a shopping mall she missed a step going off a curb and fell backwards.  She landed on her buttocks and then rolled backwards towards her head.  It is not thought that she forcibly hit her head.  There was no bleeding.  There is no loss of consciousness.  Patient has no complaint whatsoever. The caregivers and her guardian would like to have her checked out. She is been eating well.  Her behavior is normal.  She seems happy.  She has no complaint.  Past Medical History:  Diagnosis Date  . Asthma   . Chronic diarrhea   . GERD (gastroesophageal reflux disease)   . Mentally challenged   . Schizophrenia [facility])     Patient Active Problem List   Diagnosis Date Noted  . Chronic diarrhea   . Diverticulosis of colon without hemorrhage   . Diarrhea 09/17/2014  . Abdominal pain 09/17/2014  . Dysphagia, pharyngoesophageal phase 09/17/2014    Past Surgical History:  Procedure Laterality Date  . BIOPSY N/A 10/02/2014   Procedure: BIOPSY;  Surgeon: Corbin Ade, MD;  Location: [facility];  Service: Endoscopy;  Laterality: N/A;  Ascending/Descending/Sigmoid  . COLONOSCOPY WITH PROPOFOL N/A 10/02/2014   RMR: normal  . ESOPHAGEAL DILATION N/A 10/02/2014   Procedure: ESOPHAGEAL DILATION;  Surgeon: Corbin Ade, MD;  Location: [facility];  Service: Endoscopy;  Laterality: N/A;  [place] 55  . ESOPHAGOGASTRODUODENOSCOPY (EGD) WITH PROPOFOL N/A 10/02/2014   RMR: normal s/p dilator  . None      OB History   None      Home Medications    Prior to Admission medications   Medication Sig Start Date End Date Taking? Authorizing Provider  acetaminophen (TYLENOL) 500 MG tablet Take  500 mg by mouth every 6 (six) hours as needed for mild pain.    Yes [provider]  albuterol (PROVENTIL HFA;VENTOLIN HFA) 108 (90 BASE) MCG/ACT inhaler Inhale 1 puff into the lungs every 6 (six) hours as needed for wheezing or shortness of breath.    Yes [provider]  benztropine (COGENTIN) 1 MG tablet Take 1 mg by mouth 2 (two) times daily.   Yes [provider]  Calcium Carbonate-Vitamin D (CALCIUM + D PO) Take 1 capsule by mouth 2 (two) times daily.   Yes [provider]  carbamazepine (TEGRETOL) 200 MG tablet Take 200 mg by mouth 2 (two) times daily.   Yes [provider]  cetirizine (ZYRTEC) 10 MG tablet Take 10 mg by mouth daily.   Yes [provider]  FLUoxetine (PROZAC) 20 MG capsule Take 20 mg by mouth daily.   Yes [provider]  fluticasone (VERAMYST) 27.5 MCG/SPRAY nasal spray Place 2 sprays into the nose daily.   Yes [provider]  Fluticasone-Salmeterol (ADVAIR) 250-50 MCG/DOSE AEPB Inhale 2 puffs into the lungs 2 (two) times daily.    Yes [provider]  Homeopathic Products [facility] ALLERGY EYE RELIEF OP) Apply 2-3 drops to eye daily. As directed   Yes [provider]  pantoprazole (PROTONIX) 40 MG tablet Take 1 tablet (40 mg total) by  mouth daily. 09/23/15  Yes Gelene Mink, NP  prazosin (MINIPRESS) 1 MG capsule Take 1 mg by mouth at bedtime.   Yes [provider]  QUEtiapine (SEROQUEL) 25 MG tablet Take 25 mg by mouth at bedtime.   Yes [provider]  ranitidine (ZANTAC) 150 MG capsule Take 150 mg by mouth 2 (two) times daily.   Yes [provider]  ARIPiprazole (ABILIFY) 15 MG tablet Take 15 mg by mouth 2 (two) times daily.    [provider]  dicyclomine (BENTYL) 10 MG capsule Take 1 capsule (10 mg total) by mouth 3 (three) times daily before meals. HOLD DOSE IF CONSTIPATION. 01/07/15   Gelene Mink, NP  gabapentin (NEURONTIN) 400 MG capsule Take  400 mg by mouth 3 (three) times daily.    [provider]  guanFACINE (TENEX) 1 MG tablet Take 2 mg by mouth 2 (two) times daily.     [provider]  Melatonin 3 MG CAPS Take 1 capsule by mouth at bedtime.     [provider]  naproxen (NAPROSYN) 500 MG tablet Take 500 mg by mouth daily.    [provider]  polyethylene glycol (MIRALAX / GLYCOLAX) packet Take 17 g by mouth 2 (two) times daily as needed for mild constipation.    [provider]  traZODone (DESYREL) 100 MG tablet Take 100 mg by mouth at bedtime. Two tablets at bedtime    [provider]    Family History Family History  Problem Relation Age of Onset  . Colon cancer Unknown        unknown     Social History Social History   Tobacco Use  . Smoking status: Never Smoker  . Smokeless tobacco: Never Used  . Tobacco comment: Never smoked  Substance Use Topics  . Alcohol use: No    Alcohol/week: 0.0 oz  . Drug use: No     Allergies   Penicillins   Review of Systems Review of Systems  Constitutional: Negative for activity change and fatigue.  HENT: Negative for congestion and dental problem.   Eyes: Negative for pain and visual disturbance.  Respiratory: Negative for chest tightness and shortness of breath.   Cardiovascular: Negative for chest pain.  Gastrointestinal: Negative for abdominal pain, nausea and vomiting.  Genitourinary: Negative for difficulty urinating and hematuria.  Musculoskeletal: Negative for arthralgias, back pain, neck pain and neck stiffness.  Skin: Negative for color change and rash.  Neurological: Negative for dizziness, seizures, syncope and headaches.  Psychiatric/Behavioral: Negative for agitation and behavioral problems.  All other systems reviewed and are negative.  Review of systems is entirely from the caregiver.  Patient is unable to verbalize answers to questions.   Physical Exam Triage Vital Signs ED Triage Vitals  Enc  Vitals Group     BP 09/01/17 1736 (!) 147/87     Pulse Rate 09/01/17 1736 81     Resp 09/01/17 1736 18     Temp 09/01/17 1736 98.2 F (36.8 C)     Temp src --      SpO2 09/01/17 1736 100 %     Weight 09/01/17 1737 175 lb (79.4 kg)     Height --      Head Circumference --      Peak Flow --      Pain Score --      Pain Loc --      Pain Edu? --      Excl. in GC? --  No data found.  Updated Vital Signs BP (!) 147/87   Pulse 81   Temp 98.2 F (36.8 C)   Resp 18   Wt 175 lb (79.4 kg)   SpO2 100%   BMI 32.01 kg/m    Visual Acuity Right Eye Distance:   Left Eye Distance:   Bilateral Distance:    Right Eye Near:   Left Eye Near:    Bilateral Near:     Physical Exam  Constitutional: She appears well-developed and well-nourished. No distress.  HENT:  Head: Normocephalic and atraumatic.  Right Ear: External ear normal.  Left Ear: External ear normal.  Mouth/Throat: Oropharynx is clear and moist.  Atraumatic.  Scalp is palpated no tenderness or wound, no deformity  Eyes: Pupils are equal, round, and reactive to light. Conjunctivae are normal.  Esotropia  Neck: Normal range of motion. Neck supple.  Cardiovascular: Normal rate and regular rhythm.  No murmur heard. Pulmonary/Chest: Effort normal and breath sounds normal. No respiratory distress.  Abdominal: Soft. Bowel sounds are normal. There is no tenderness.  Musculoskeletal: She exhibits no edema.  Moves all 4 extremities well.  No ecchymosis  Neurological: She is alert.  Skin: Skin is warm and dry.  Psychiatric: Her behavior is normal.  Simple 1 word answers.  Nursing note and vitals reviewed.    UC Treatments / Results    Initial Impression / Assessment and Plan / UC Course  I have reviewed the triage vital signs and the nursing notes.  Pertinent labs & imaging results that were available during my care of the patient were reviewed by me and considered in my medical decision making (see chart for  details).      Final Clinical Impressions(s) / UC Diagnoses   Final diagnoses:  Fall, initial encounter     Discharge Instructions     You have been checked out and I see no sign of injury Return as needed   ED Prescriptions    None     Controlled Substance Prescriptions [place] Controlled Substance Registry consulted? Not Applicable  Eustace Moore, MD 09/01/17 [phone]

## 2017-10-28 IMAGING — CT CT HEAD CODE STROKE
3 of 4 series · 15 of 47 positions shown, 18 images · non-contrast
Comparison: None.

CLINICAL DATA: Code stroke. Sudden onset blurred vision, slurred
speech, and fatigue. Baseline dysconjugate gaze.

EXAM:
CT HEAD WITHOUT CONTRAST
TECHNIQUE: Contiguous axial images were obtained from the base of the skull
through the vertex without intravenous contrast.

[Series 2: head w/o · axial · non-contrast · 0.48mm/px · z∈[+94,+224]mm · 9 of 32 slices shown, 12 images]
[im 3/32  brain]
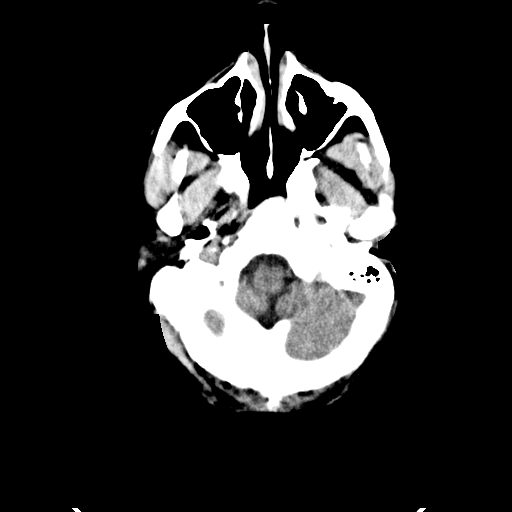
[im 3/32  bone]
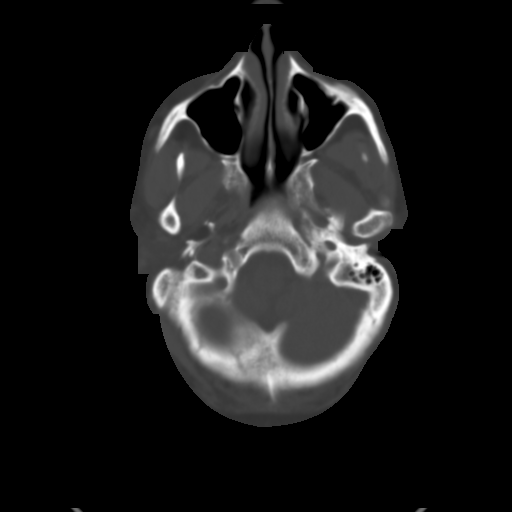
[im 7/32  brain]
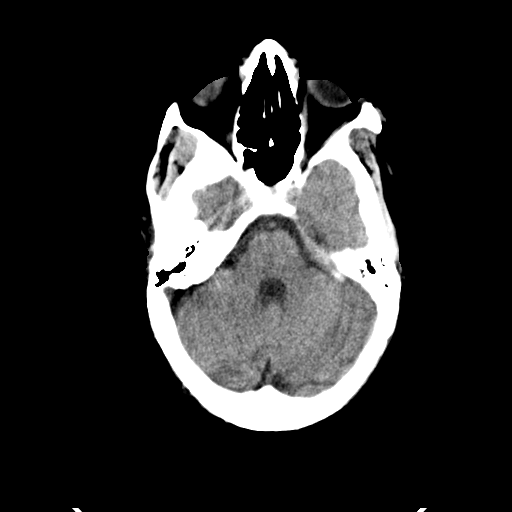
[im 9/32  brain]
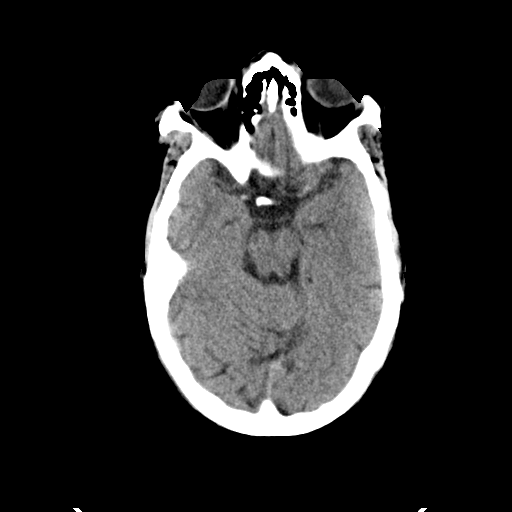
[im 14/32  brain]
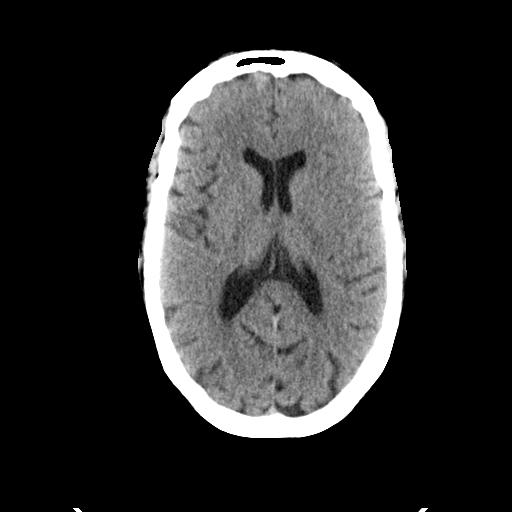
[im 16/32  brain]
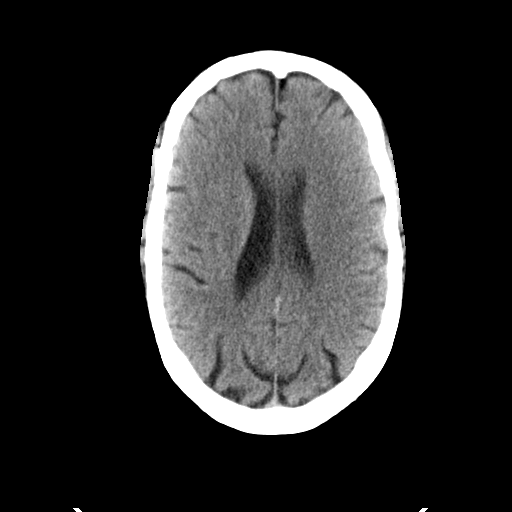
[im 16/32  bone]
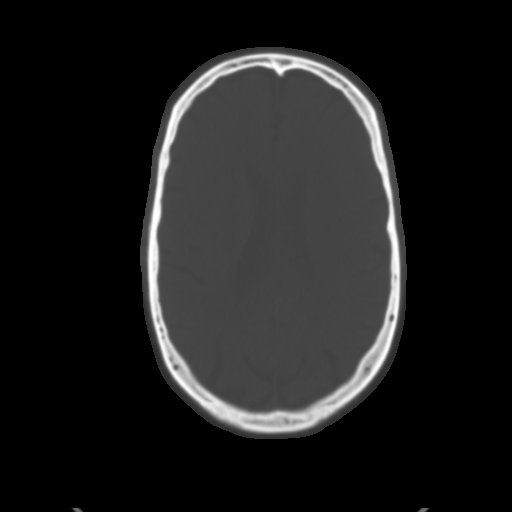
[im 18/32  brain]
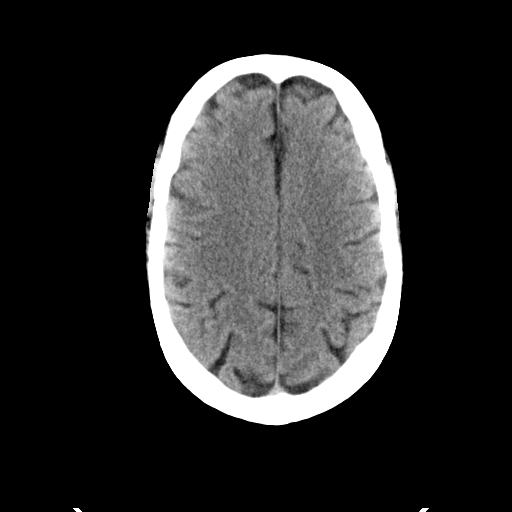
[im 23/32  brain]
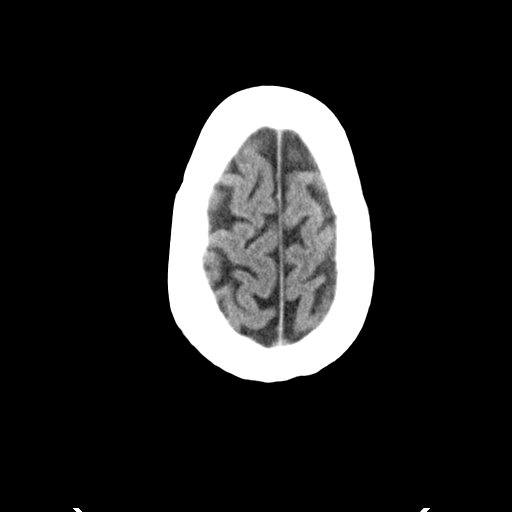
[im 25/32  brain]
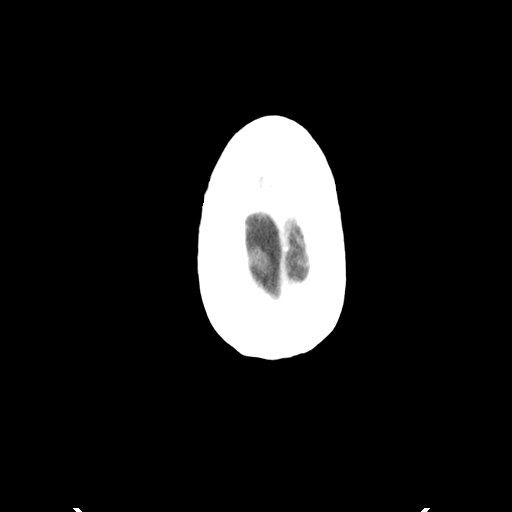
[im 29/32  brain]
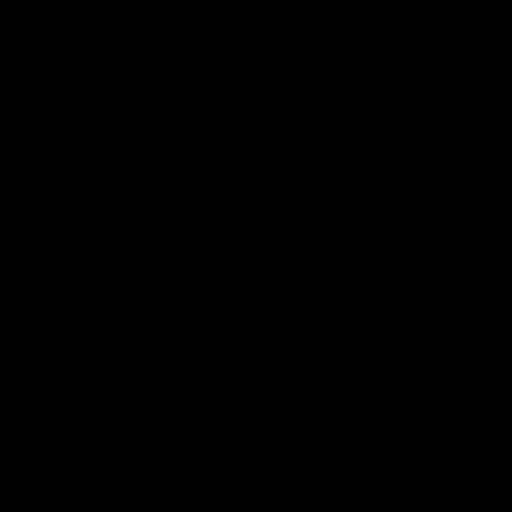
[im 29/32  bone]
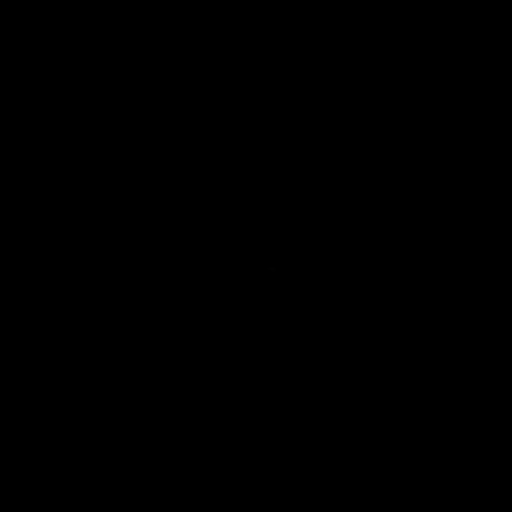

[Series 4: coronal · coronal · 0.34mm/px · 3 of 69 slices shown]
[im 23/69  brain]
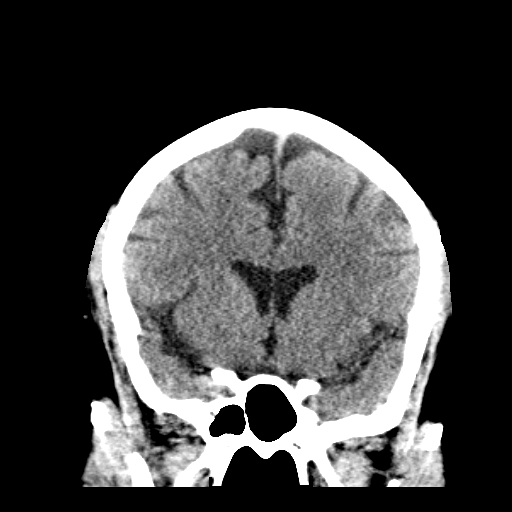
[im 31/69  brain]
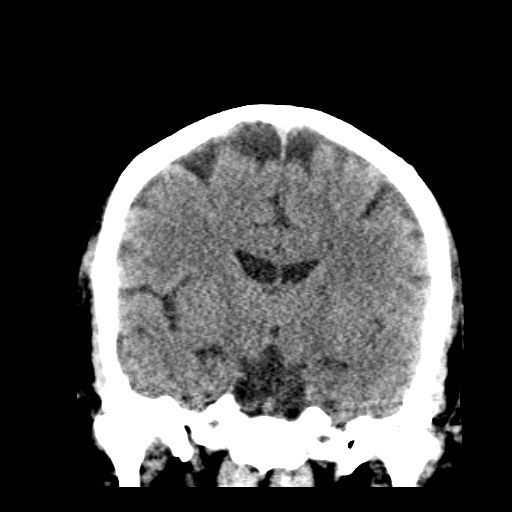
[im 38/69  brain]
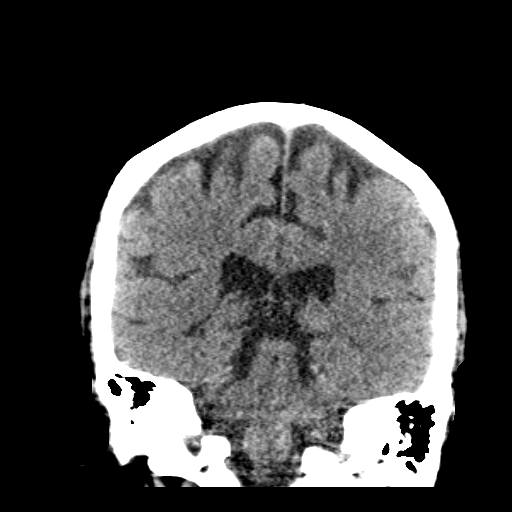

[Series 5: sagittal · sagittal · 0.33mm/px · 3 of 49 slices shown]
[im 17/49  brain]
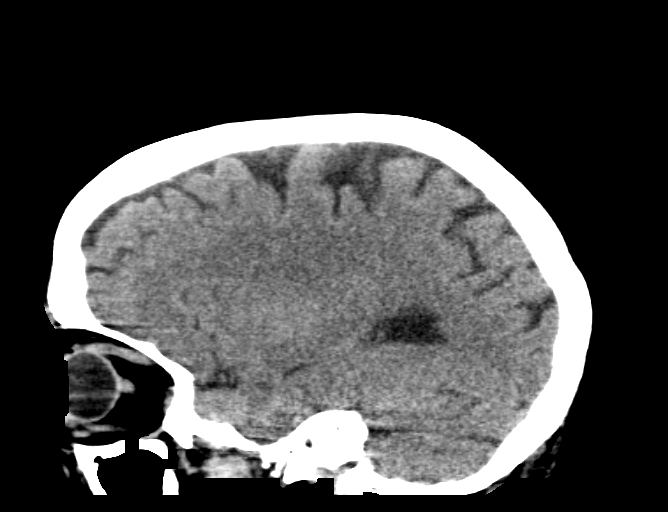
[im 25/49  brain]
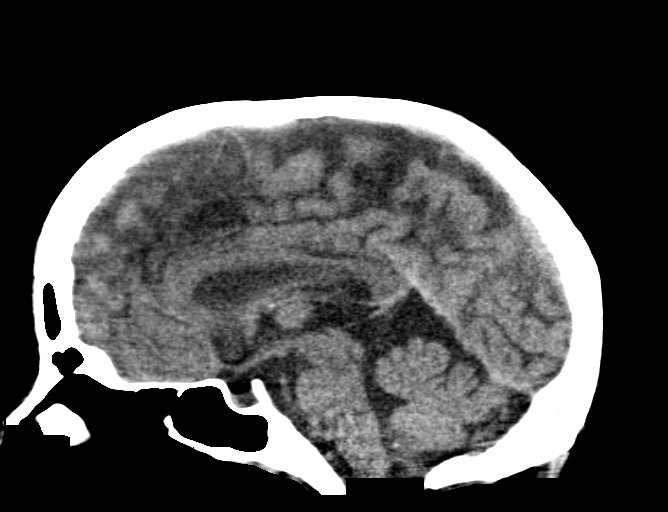
[im 33/49  brain]
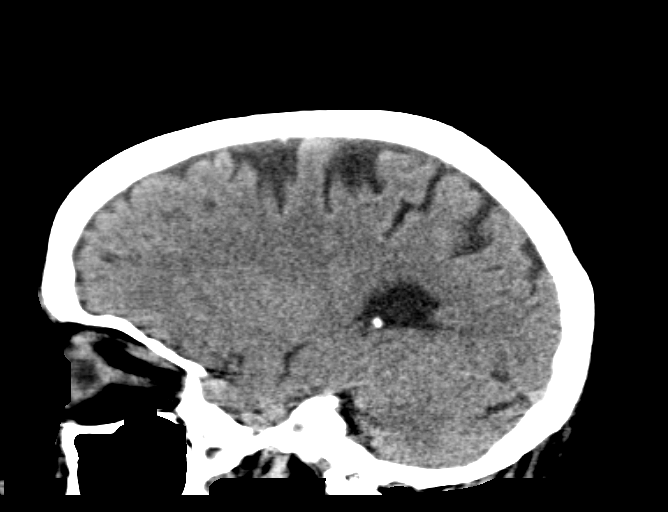

[15 of 47 positions shown; findings below may reference images not displayed]

FINDINGS: There is no evidence of acute cortical infarct, intracranial
hemorrhage, mass, midline shift, or extra-axial fluid collection.
The ventricles and sulci are normal. Gray-white differentiation is
preserved.

Orbits are unremarkable aside from dysconjugate gaze. The visualized
paranasal sinuses and mastoid air cells are clear. No acute osseous
abnormality is identified.

ASPECTS ([place]ber[place] Stroke Program Early CT Score,
[URL]

- Ganglionic level infarction (caudate, lentiform nuclei, internal
capsule, insula, M1-M3 cortex): 7

- Supraganglionic infarction (M4-M6 cortex): 3

Total score (0-10 with 10 being normal): 10
IMPRESSION: 1. Unremarkable CT appearance of the brain.
2. ASPECTS score 10.
These results were called by telephone at the time of interpretation
on 12/05/2015 at [DATE] to Dr. MASLAR ACKOVIC , who verbally
acknowledged these results.

## 2017-12-29 ENCOUNTER — Other Ambulatory Visit: Payer: Self-pay | Admitting: Family Medicine

## 2018-10-11 ENCOUNTER — Ambulatory Visit (HOSPITAL_COMMUNITY)
Admission: RE | Admit: 2018-10-11 | Discharge: 2018-10-11 | Disposition: A | Discharge status: Home / Self Care | Payer: Medicaid Other | Attending: Psychiatry | Admitting: Psychiatry

## 2018-10-11 ENCOUNTER — Encounter (HOSPITAL_COMMUNITY): Payer: Self-pay | Admitting: Behavioral Health

## 2018-10-11 DIAGNOSIS — K219 Gastro-esophageal reflux disease without esophagitis: Secondary | ICD-10-CM | POA: Diagnosis not present

## 2018-10-11 DIAGNOSIS — J45909 Unspecified asthma, uncomplicated: Secondary | ICD-10-CM | POA: Diagnosis not present

## 2018-10-11 DIAGNOSIS — F79 Unspecified intellectual disabilities: Secondary | ICD-10-CM | POA: Insufficient documentation

## 2018-10-11 DIAGNOSIS — F209 Schizophrenia, unspecified: Secondary | ICD-10-CM | POA: Insufficient documentation

## 2018-10-11 DIAGNOSIS — Z880 Allergy status to penicillin: Secondary | ICD-10-CM | POA: Insufficient documentation

## 2018-10-11 NOTE — H&P (Addendum)
Behavioral Health Medical Screening Exam  Evelyn Allen is an 53 y.o. female. Pt lives in an AFL. She has a history of IDD, Cognitive impairment and schizophrenia. Pt has had a recent medication change and her behavior has worsened since then. She is here with her DSS case Freight forwarder. Pt does not meet inpatient criteria. Recommendation is to allow medication change time to take effect, follow up with her care coordinator and her outpatient provider for any further medication management.   Total Time spent with patient: 30 minutes  Psychiatric Specialty Exam: Physical Exam  Constitutional: She appears well-developed and well-nourished.  HENT:  Head: Normocephalic.  Respiratory: Effort normal.  Musculoskeletal: Normal range of motion.  Neurological: She is alert.    Review of Systems  Psychiatric/Behavioral: The patient is nervous/anxious.   All other systems reviewed and are negative.   Blood pressure 115/75, pulse 85, temperature 98.1 F (36.7 C), temperature source Oral, resp. rate 18, SpO2 100 %.There is no height or weight on file to calculate BMI.  General Appearance: Casual  Eye Contact:  Fair  Speech:  Slow  Volume:  Decreased  Mood:  Anxious and Irritable  Affect:  Congruent  Thought Process:  Disorganized  Orientation:  Other:  person and place  Thought Content:  Illogical  Suicidal Thoughts:  No  Homicidal Thoughts:  No  Memory:  Immediate;   Fair Recent;   Poor Remote;   Poor  Judgement:  Impaired  Insight:  Lacking  Psychomotor Activity:  Normal  Concentration: Concentration: Poor and Attention Span: Poor  Recall:  Poor  Fund of Knowledge:Poor  Language: Fair  Akathisia:  Negative  Handed:  Right  AIMS (if indicated):     Assets:  Financial Resources/Insurance Housing Physical Health Social Support  Sleep:       Musculoskeletal: Strength & Muscle Tone: within normal limits Gait & Station: not tested Patient leans: N/A  Blood pressure 115/75, pulse  85, temperature 98.1 F (36.7 C), temperature source Oral, resp. rate 18, SpO2 100 %.  Recommendations:  Based on my evaluation the patient does not appear to have an emergency medical condition.   Ethelene Hal, NP 10/11/2018, 1:03 PM   Patient's chart reviewed. Reviewed the information documented and agree with the treatment plan.  Buford Dresser, DO 10/11/18 1:38 PM
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
## 2018-10-17 ENCOUNTER — Emergency Department (HOSPITAL_COMMUNITY)
Admission: EM | Admit: 2018-10-17 | Discharge: 2018-10-18 | Disposition: A | Discharge status: Home / Self Care | Payer: Medicaid Other | Attending: Emergency Medicine | Admitting: Emergency Medicine

## 2018-10-17 ENCOUNTER — Encounter (HOSPITAL_COMMUNITY): Payer: Self-pay

## 2018-10-17 ENCOUNTER — Other Ambulatory Visit: Payer: Self-pay

## 2018-10-17 DIAGNOSIS — F89 Unspecified disorder of psychological development: Secondary | ICD-10-CM | POA: Diagnosis not present

## 2018-10-17 DIAGNOSIS — Z79899 Other long term (current) drug therapy: Secondary | ICD-10-CM | POA: Insufficient documentation

## 2018-10-17 DIAGNOSIS — Z20828 Contact with and (suspected) exposure to other viral communicable diseases: Secondary | ICD-10-CM | POA: Insufficient documentation

## 2018-10-17 DIAGNOSIS — F209 Schizophrenia, unspecified: Secondary | ICD-10-CM | POA: Diagnosis present

## 2018-10-17 DIAGNOSIS — N390 Urinary tract infection, site not specified: Secondary | ICD-10-CM | POA: Diagnosis not present

## 2018-10-17 DIAGNOSIS — R6250 Unspecified lack of expected normal physiological development in childhood: Secondary | ICD-10-CM | POA: Diagnosis present

## 2018-10-17 DIAGNOSIS — F99 Mental disorder, not otherwise specified: Secondary | ICD-10-CM | POA: Insufficient documentation

## 2018-10-17 DIAGNOSIS — J45909 Unspecified asthma, uncomplicated: Secondary | ICD-10-CM | POA: Insufficient documentation

## 2018-10-17 LAB — CBC
HCT: 42.7 % (ref 36.0–46.0)
Hemoglobin: 13.9 g/dL (ref 12.0–15.0)
MCH: 31.4 pg (ref 26.0–34.0)
MCHC: 32.6 g/dL (ref 30.0–36.0)
MCV: 96.4 fL (ref 80.0–100.0)
Platelets: 144 10*3/uL — ABNORMAL LOW (ref 150–400)
RBC: 4.43 MIL/uL (ref 3.87–5.11)
RDW: 11.9 % (ref 11.5–15.5)
WBC: 6.9 10*3/uL (ref 4.0–10.5)
nRBC: 0 % (ref 0.0–0.2)

## 2018-10-17 LAB — URINALYSIS, ROUTINE W REFLEX MICROSCOPIC
Bilirubin Urine: NEGATIVE
Glucose, UA: NEGATIVE mg/dL
Ketones, ur: NEGATIVE mg/dL
Nitrite: NEGATIVE
Protein, ur: NEGATIVE mg/dL
Specific Gravity, Urine: 1.009 (ref 1.005–1.030)
pH: 6 (ref 5.0–8.0)

## 2018-10-17 LAB — COMPREHENSIVE METABOLIC PANEL
ALT: 14 U/L (ref 0–44)
AST: 23 U/L (ref 15–41)
Albumin: 3.6 g/dL (ref 3.5–5.0)
Alkaline Phosphatase: 148 U/L — ABNORMAL HIGH (ref 38–126)
Anion gap: 10 (ref 5–15)
BUN: 15 mg/dL (ref 6–20)
CO2: 23 mmol/L (ref 22–32)
Calcium: 8.9 mg/dL (ref 8.9–10.3)
Chloride: 107 mmol/L (ref 98–111)
Creatinine, Ser: 1 mg/dL (ref 0.44–1.00)
GFR calc Af Amer: >60 mL/min (ref >60)
GFR calc non Af Amer: >60 mL/min (ref >60)
Glucose, Bld: 108 mg/dL — ABNORMAL HIGH (ref 70–99)
Potassium: 4.3 mmol/L (ref 3.5–5.1)
Sodium: 140 mmol/L (ref 135–145)
Total Bilirubin: 0.2 mg/dL — ABNORMAL LOW (ref 0.3–1.2)
Total Protein: 6.5 g/dL (ref 6.5–8.1)

## 2018-10-17 LAB — RAPID URINE DRUG SCREEN, HOSP PERFORMED
Amphetamines: NOT DETECTED
Barbiturates: NOT DETECTED
Benzodiazepines: NOT DETECTED
Cocaine: NOT DETECTED
Opiates: NOT DETECTED
Tetrahydrocannabinol: NOT DETECTED

## 2018-10-17 LAB — ETHANOL: Alcohol, Ethyl (B): <10 mg/dL (ref <10)

## 2018-10-17 LAB — CARBAMAZEPINE LEVEL, TOTAL: Carbamazepine Lvl: <2 ug/mL — ABNORMAL LOW (ref 4.0–12.0)

## 2018-10-17 LAB — SARS CORONAVIRUS 2 BY RT PCR (HOSPITAL ORDER, PERFORMED IN ~~LOC~~ HOSPITAL LAB): SARS Coronavirus 2: NEGATIVE

## 2018-10-17 MED ORDER — CLONIDINE HCL 0.1 MG PO TABS
0.1000 mg | ORAL_TABLET | Freq: Three times a day (TID) | ORAL | Status: DC
  Administered 2018-10-17 – 2018-10-18 (×2): 0.1 mg via ORAL
  Filled 2018-10-17 (×3): qty 1

## 2018-10-17 MED ORDER — CLONAZEPAM 0.5 MG PO TABS
0.5000 mg | ORAL_TABLET | Freq: Two times a day (BID) | ORAL | Status: DC
  Administered 2018-10-17: 0.5 mg via ORAL
  Filled 2018-10-17: qty 1

## 2018-10-17 MED ORDER — DIVALPROEX SODIUM 500 MG PO DR TAB
500.0000 mg | DELAYED_RELEASE_TABLET | Freq: Two times a day (BID) | ORAL | Status: DC
  Administered 2018-10-17 – 2018-10-18 (×2): 500 mg via ORAL
  Filled 2018-10-17 (×3): qty 1

## 2018-10-17 MED ORDER — CALCIUM CARBONATE-VITAMIN D 500-200 MG-UNIT PO TABS
1.0000 | ORAL_TABLET | Freq: Two times a day (BID) | ORAL | Status: DC
  Administered 2018-10-17 – 2018-10-18 (×2): 1 via ORAL
  Filled 2018-10-17 (×2): qty 1

## 2018-10-17 MED ORDER — SULFAMETHOXAZOLE-TRIMETHOPRIM 800-160 MG PO TABS
1.0000 | ORAL_TABLET | Freq: Two times a day (BID) | ORAL | Status: DC
  Administered 2018-10-17 – 2018-10-18 (×2): 1 via ORAL
  Filled 2018-10-17 (×3): qty 1

## 2018-10-17 MED ORDER — HYDROXYZINE HCL 25 MG PO TABS
50.0000 mg | ORAL_TABLET | Freq: Every day | ORAL | Status: DC
  Administered 2018-10-17: 50 mg via ORAL
  Filled 2018-10-17: qty 2

## 2018-10-17 MED ORDER — LORATADINE 10 MG PO TABS
10.0000 mg | ORAL_TABLET | Freq: Every day | ORAL | Status: DC
  Administered 2018-10-18: 10 mg via ORAL
  Filled 2018-10-17 (×2): qty 1

## 2018-10-17 MED ORDER — BENEFIBER DRINK MIX PO PACK: 1.0000 | PACK | Freq: Every day | ORAL | Status: DC

## 2018-10-17 MED ORDER — MOMETASONE FURO-FORMOTEROL FUM 200-5 MCG/ACT IN AERO
2.0000 | INHALATION_SPRAY | Freq: Two times a day (BID) | RESPIRATORY_TRACT | Status: DC
  Administered 2018-10-17 – 2018-10-18 (×2): 2 via RESPIRATORY_TRACT
  Filled 2018-10-17: qty 8.8

## 2018-10-17 MED ORDER — PANTOPRAZOLE SODIUM 40 MG PO TBEC
40.0000 mg | DELAYED_RELEASE_TABLET | Freq: Every day | ORAL | Status: DC
  Administered 2018-10-18: 40 mg via ORAL
  Filled 2018-10-17 (×2): qty 1

## 2018-10-17 MED ORDER — MELATONIN 3 MG PO TABS
3.0000 mg | ORAL_TABLET | Freq: Every day | ORAL | Status: DC
  Administered 2018-10-17: 3 mg via ORAL
  Filled 2018-10-17: qty 1

## 2018-10-17 MED ORDER — OLANZAPINE 5 MG PO TABS
5.0000 mg | ORAL_TABLET | Freq: Two times a day (BID) | ORAL | Status: DC
  Administered 2018-10-17 – 2018-10-18 (×2): 5 mg via ORAL
  Filled 2018-10-17 (×3): qty 1

## 2018-10-17 MED ORDER — ALBUTEROL SULFATE HFA 108 (90 BASE) MCG/ACT IN AERS
1.0000 | INHALATION_SPRAY | RESPIRATORY_TRACT | Status: DC | PRN
  Filled 2018-10-17: qty 6.7

## 2018-10-17 MED ORDER — PSYLLIUM 95 % PO PACK
1.0000 | PACK | Freq: Every day | ORAL | Status: DC
  Administered 2018-10-18: 1 via ORAL
  Filled 2018-10-17 (×2): qty 1

## 2018-10-17 MED ORDER — OLANZAPINE 10 MG PO TABS
10.0000 mg | ORAL_TABLET | Freq: Two times a day (BID) | ORAL | Status: DC
  Administered 2018-10-17 – 2018-10-18 (×2): 10 mg via ORAL
  Filled 2018-10-17 (×3): qty 1

## 2018-10-17 MED ORDER — ACETAMINOPHEN 500 MG PO TABS: 500.0000 mg | ORAL_TABLET | Freq: Four times a day (QID) | ORAL | Status: DC | PRN
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
## 2018-10-17 NOTE — ED Notes (Signed)
Per Arville Go, RN, patient was medicated and is asleep. TTS will attempt at later time.

## 2018-10-17 NOTE — Progress Notes (Signed)
Received Evelyn Allen in her room asleep with the sitter at the bedside. No acute distress noted. She was compliant with her medications and drifted off to sleep. She was awaken to talk with TTS and the conversation was interrupted. She got very upset with intervals of crying while  waiting for the  return call.

## 2018-10-17 NOTE — ED Notes (Signed)
AFL provider is [facility]  [phone].  DSS guardian is Barbarann Ehlers  [phone].  Lenor Coffin is her Day worker  [phone].  They are all very concerned about her outburst.  They say this is very out of character for her.   They seem to all be very supportive and involved in her care.
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
## 2018-10-18 DIAGNOSIS — F89 Unspecified disorder of psychological development: Secondary | ICD-10-CM

## 2018-10-18 DIAGNOSIS — F209 Schizophrenia, unspecified: Secondary | ICD-10-CM

## 2018-10-18 DIAGNOSIS — N390 Urinary tract infection, site not specified: Secondary | ICD-10-CM | POA: Diagnosis present

## 2018-10-18 DIAGNOSIS — R6250 Unspecified lack of expected normal physiological development in childhood: Secondary | ICD-10-CM | POA: Diagnosis present

## 2018-10-18 MED ORDER — CLONAZEPAM 1 MG PO TABS: 1.0000 mg | ORAL_TABLET | Freq: Every day | ORAL | Status: DC

## 2018-10-18 MED ORDER — CLONAZEPAM 0.5 MG PO TABS
0.5000 mg | ORAL_TABLET | Freq: Every day | ORAL | Status: DC
  Administered 2018-10-18: 0.5 mg via ORAL
  Filled 2018-10-18: qty 1

## 2018-10-18 NOTE — Discharge Instructions (Signed)
For your behavioral health needs, you are advised to continue treatment with Corena Pilgrim, MD at the [facility].  Contact him at your earliest opportunity to schedule a follow up appointment:       [facility]      [address] [place], [place] [address]      [phone]
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
## 2018-10-18 NOTE — ED Notes (Signed)
Patient's vital signs taken, pt now back asleep.

## 2018-10-18 NOTE — BH Assessment (Addendum)
[facility] Assessment Progress Note  Per Buford Dresser, DO, this pt does not require psychiatric hospitalization at this time.  Pt is to be discharged from [facility] with recommendation to continue treatment with Corena Pilgrim, MD at her earliest opportunity.  This has been included in pt's discharge instructions.  At 11:30 this writer called pt's [facility] DSS guardian, Barbarann Ehlers (office: [phone]; cell: [phone]) to notify her of pt's disposition.  She provided contact information for pt's AFL contact, [facility] [phone]).  At 11:35 I called Ms Durenda Guthrie to notify her.  She reports that she will be able to pick pt up around 16:00.  Pt's nurse, Eustaquio Maize, has been notified.  Please note that Ms Carley Hammed has faxed pt's psychometric testing to me.  I will label it and leave it for medical records to scan.  Jalene Mullet, MA Triage Specialist [phone]   Addendum:  At 12:28 Ms [facility] calls back to revise her estimated arrival time.  She reports that she will be at [facility] between 16:30 and 17:00.  Eustaquio Maize has been notified.  Jalene Mullet, [place] Coordinator [phone]

## 2018-10-18 NOTE — ED Notes (Signed)
Pt alert. Cooperative. Loud. Confused. Redirectable.
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
## 2018-10-19 LAB — URINE CULTURE: Culture: 80000 — AB

## 2018-10-20 ENCOUNTER — Telehealth: Payer: Self-pay | Admitting: Emergency Medicine

## 2018-10-20 NOTE — Progress Notes (Signed)
ED Antimicrobial Stewardship Positive Culture Follow Up   Evelyn Allen is an 53 y.o. female who presented to [facility] on 10/17/2018 with a chief complaint of  Chief Complaint  Patient presents with  . Medical Clearance  . IVC    Recent Results (from the past 720 hour(s))  Urine culture     Status: Abnormal   Collection Time: 10/17/18  5:08 PM   Specimen: Urine, Random  Result Value Ref Range Status   Specimen Description   Final    URINE, RANDOM Performed at [facility] [address], [place], [place] [address]    Special Requests   Final    NONE Performed at [facility], [place] [address], [place], [place] [address]    Culture 80,000 COLONIES/mL KLEBSIELLA PNEUMONIAE (A)  Final   Report Status 10/19/2018 FINAL  Final   Organism ID, Bacteria KLEBSIELLA PNEUMONIAE (A)  Final      Susceptibility   Klebsiella pneumoniae - MIC*    AMPICILLIN >=32 RESISTANT Resistant     CEFAZOLIN >=64 RESISTANT Resistant     CEFTRIAXONE <=1 SENSITIVE Sensitive     CIPROFLOXACIN 1 SENSITIVE Sensitive     GENTAMICIN <=1 SENSITIVE Sensitive     IMIPENEM <=0.25 SENSITIVE Sensitive     NITROFURANTOIN >=512 RESISTANT Resistant     TRIMETH/SULFA 40 SENSITIVE Sensitive     AMPICILLIN/SULBACTAM >=32 RESISTANT Resistant     PIP/TAZO >=128 RESISTANT Resistant     Extended ESBL NEGATIVE Sensitive     * 80,000 COLONIES/mL KLEBSIELLA PNEUMONIAE  SARS Coronavirus 2 (CEPHEID - Performed in [place] hospital lab), Hosp Order     Status: None   Collection Time: 10/17/18  5:46 PM   Specimen: Nasopharyngeal Swab  Result Value Ref Range Status   SARS Coronavirus 2 NEGATIVE NEGATIVE Final    Comment: (NOTE) If result is NEGATIVE SARS-CoV-2 target nucleic acids are NOT DETECTED. The SARS-CoV-2 RNA is generally detectable in upper and lower  respiratory specimens during the acute phase of infection. The lowest  concentration of SARS-CoV-2 viral copies this assay can  detect is 250  copies / mL. A negative result does not preclude SARS-CoV-2 infection  and should not be used as the sole basis for treatment or other  patient management decisions.  A negative result may occur with  improper specimen collection / handling, submission of specimen other  than nasopharyngeal swab, presence of viral mutation(s) within the  areas targeted by this assay, and inadequate number of viral copies  (<250 copies / mL). A negative result must be combined with clinical  observations, patient history, and epidemiological information. If result is POSITIVE SARS-CoV-2 target nucleic acids are DETECTED. The SARS-CoV-2 RNA is generally detectable in upper and lower  respiratory specimens dur ing the acute phase of infection.  Positive  results are indicative of active infection with SARS-CoV-2.  Clinical  correlation with patient history and other diagnostic information is  necessary to determine patient infection status.  Positive results do  not rule out bacterial infection or co-infection with other viruses. If result is PRESUMPTIVE POSTIVE SARS-CoV-2 nucleic acids MAY BE PRESENT.   A presumptive positive result was obtained on the submitted specimen  and confirmed on repeat testing.  While 2019 novel coronavirus  (SARS-CoV-2) nucleic acids may be present in the submitted sample  additional confirmatory testing may be necessary for epidemiological  and / or clinical management purposes  to differentiate between  SARS-CoV-2 and other Sarbecovirus currently  known to infect humans.  If clinically indicated additional testing with an alternate test  methodology [phone]([id number]) is advised. The SARS-CoV-2 RNA is generally  detectable in upper and lower respiratory sp ecimens during the acute  phase of infection. The expected result is Negative. Fact Sheet for Patients:  BoilerBrush.com.cyhttps://www.fda.gov/media/136312/download Fact Sheet for Healthcare  Providers: https://pope.com/https://www.fda.gov/media/136313/download This test is not yet approved or cleared by the [place] FDA and has been authorized for detection and/or diagnosis of SARS-CoV-2 by FDA under an Emergency Use Authorization (EUA).  This EUA will remain in effect (meaning this test can be used) for the duration of the COVID-19 declaration under Section 564(b)(1) of the Act, 21 U.S.C. section 360bbb-3(b)(1), unless the authorization is terminated or revoked sooner. Performed at [facility], [address] [place], [place] [address]     [x]  Treated with nitrofurantoin, organism resistant to prescribed antimicrobial  New antibiotic prescription: SMX/TMP (Bactrim) 1 double strength tablet PO BID x 3 days. No refills.   ED Provider: Army MeliaLaura Murphy, PA-C  Cindi CarbonMary M Tyshea Imel, PharmD 10/20/2018, 10:43 AM Clinical Pharmacist [phone]

## 2018-10-20 NOTE — Telephone Encounter (Signed)
Post ED Visit - Positive Culture Follow-up: Unsuccessful Patient Follow-up  Culture assessed and recommendations reviewed by:  []  Elenor Quinones, Pharm.D. []  Heide Guile, Pharm.D., BCPS AQ-ID []  Parks Neptune, Pharm.D., BCPS []  Alycia Rossetti, Pharm.D., BCPS []  Melia, Pharm.D., BCPS, AAHIVP []  Legrand Como, Pharm.D., BCPS, AAHIVP []  Wynell Balloon, PharmD [x]  Theodis Shove, PharmD, BCPS  Positive urine culture  []  Patient discharged without antimicrobial prescription and treatment is now indicated [x]  Organism is resistant to prescribed ED discharge antimicrobial []  Patient with positive blood cultures   Unable to contact patient after 3 attempts, letter will be sent to address on file  Plan: Stop taking Nitrofurantoin Start: Bactrim DS, one tablet  PO BID x three days - Suella Broad PA   Evelyn Allen 10/20/2018, 4:30 PM

## 2018-11-26 ENCOUNTER — Encounter (HOSPITAL_COMMUNITY): Payer: Self-pay | Admitting: Emergency Medicine

## 2018-11-26 ENCOUNTER — Emergency Department (HOSPITAL_COMMUNITY)
Admission: EM | Admit: 2018-11-26 | Discharge: 2018-11-27 | Disposition: A | Discharge status: Home / Self Care | Payer: Medicaid Other | Attending: Emergency Medicine | Admitting: Emergency Medicine

## 2018-11-26 ENCOUNTER — Other Ambulatory Visit: Payer: Self-pay

## 2018-11-26 DIAGNOSIS — Z79899 Other long term (current) drug therapy: Secondary | ICD-10-CM | POA: Diagnosis not present

## 2018-11-26 DIAGNOSIS — Z20828 Contact with and (suspected) exposure to other viral communicable diseases: Secondary | ICD-10-CM | POA: Insufficient documentation

## 2018-11-26 DIAGNOSIS — N3000 Acute cystitis without hematuria: Secondary | ICD-10-CM

## 2018-11-26 DIAGNOSIS — R072 Precordial pain: Secondary | ICD-10-CM | POA: Diagnosis not present

## 2018-11-26 DIAGNOSIS — J45909 Unspecified asthma, uncomplicated: Secondary | ICD-10-CM | POA: Insufficient documentation

## 2018-11-26 DIAGNOSIS — F209 Schizophrenia, unspecified: Secondary | ICD-10-CM | POA: Insufficient documentation

## 2018-11-26 DIAGNOSIS — R0789 Other chest pain: Secondary | ICD-10-CM

## 2018-11-26 DIAGNOSIS — R079 Chest pain, unspecified: Secondary | ICD-10-CM | POA: Diagnosis present

## 2018-11-26 LAB — CBC
HCT: 40.7 % (ref 36.0–46.0)
Hemoglobin: 13.4 g/dL (ref 12.0–15.0)
MCH: 30.5 pg (ref 26.0–34.0)
MCHC: 32.9 g/dL (ref 30.0–36.0)
MCV: 92.7 fL (ref 80.0–100.0)
Platelets: 183 10*3/uL (ref 150–400)
RBC: 4.39 MIL/uL (ref 3.87–5.11)
RDW: 11.5 % (ref 11.5–15.5)
WBC: 5.2 10*3/uL (ref 4.0–10.5)
nRBC: 0 % (ref 0.0–0.2)

## 2018-11-26 LAB — URINALYSIS, ROUTINE W REFLEX MICROSCOPIC
Bilirubin Urine: NEGATIVE
Glucose, UA: NEGATIVE mg/dL
Hgb urine dipstick: NEGATIVE
Ketones, ur: NEGATIVE mg/dL
Nitrite: POSITIVE — AB
Protein, ur: NEGATIVE mg/dL
Specific Gravity, Urine: 1.008 (ref 1.005–1.030)
pH: 7 (ref 5.0–8.0)

## 2018-11-26 LAB — RAPID URINE DRUG SCREEN, HOSP PERFORMED
Amphetamines: NOT DETECTED
Barbiturates: NOT DETECTED
Benzodiazepines: NOT DETECTED
Cocaine: NOT DETECTED
Opiates: NOT DETECTED
Tetrahydrocannabinol: NOT DETECTED

## 2018-11-26 LAB — BASIC METABOLIC PANEL
Anion gap: 10 (ref 5–15)
BUN: 11 mg/dL (ref 6–20)
CO2: 22 mmol/L (ref 22–32)
Calcium: 9.6 mg/dL (ref 8.9–10.3)
Chloride: 107 mmol/L (ref 98–111)
Creatinine, Ser: 1.2 mg/dL — ABNORMAL HIGH (ref 0.44–1.00)
GFR calc Af Amer: >60 mL/min (ref >60)
GFR calc non Af Amer: 52 mL/min — ABNORMAL LOW (ref >60)
Glucose, Bld: 93 mg/dL (ref 70–99)
Potassium: 4 mmol/L (ref 3.5–5.1)
Sodium: 139 mmol/L (ref 135–145)

## 2018-11-26 LAB — TROPONIN I (HIGH SENSITIVITY)
Troponin I (High Sensitivity): <2 ng/L (ref <18)
Troponin I (High Sensitivity): <2 ng/L (ref <18)

## 2018-11-26 LAB — SARS CORONAVIRUS 2 BY RT PCR (HOSPITAL ORDER, PERFORMED IN ~~LOC~~ HOSPITAL LAB): SARS Coronavirus 2: NEGATIVE

## 2018-11-26 MED ORDER — GUANFACINE HCL 1 MG PO TABS
2.0000 mg | ORAL_TABLET | Freq: Two times a day (BID) | ORAL | Status: DC
  Filled 2018-11-26: qty 2

## 2018-11-26 MED ORDER — PANTOPRAZOLE SODIUM 40 MG PO TBEC
40.0000 mg | DELAYED_RELEASE_TABLET | Freq: Every day | ORAL | Status: DC
  Administered 2018-11-26: 40 mg via ORAL
  Filled 2018-11-26: qty 1

## 2018-11-26 MED ORDER — HYDROXYZINE HCL 25 MG PO TABS: 25.0000 mg | ORAL_TABLET | Freq: Two times a day (BID) | ORAL | Status: DC

## 2018-11-26 MED ORDER — LORATADINE 10 MG PO TABS
10.0000 mg | ORAL_TABLET | Freq: Every day | ORAL | Status: DC
  Administered 2018-11-26: 10 mg via ORAL
  Filled 2018-11-26: qty 1

## 2018-11-26 MED ORDER — CIPROFLOXACIN HCL 500 MG PO TABS: 500.0000 mg | ORAL_TABLET | Freq: Two times a day (BID) | ORAL | 0 refills | Status: AC

## 2018-11-26 MED ORDER — VITAMIN D 25 MCG (1000 UNIT) PO TABS: 2000.0000 [IU] | ORAL_TABLET | Freq: Two times a day (BID) | ORAL | Status: DC

## 2018-11-26 MED ORDER — OLANZAPINE 5 MG PO TABS
5.0000 mg | ORAL_TABLET | Freq: Two times a day (BID) | ORAL | Status: DC
  Filled 2018-11-26: qty 1

## 2018-11-26 MED ORDER — MELATONIN 3 MG PO TABS
1.0000 | ORAL_TABLET | Freq: Every day | ORAL | Status: DC
  Filled 2018-11-26: qty 1

## 2018-11-26 MED ORDER — ZIPRASIDONE HCL 40 MG PO CAPS: 60.0000 mg | ORAL_CAPSULE | Freq: Every day | ORAL | Status: DC

## 2018-11-26 MED ORDER — ASPIRIN 81 MG PO CHEW
324.0000 mg | CHEWABLE_TABLET | Freq: Once | ORAL | Status: AC
  Administered 2018-11-26: 324 mg via ORAL
  Filled 2018-11-26: qty 4

## 2018-11-26 MED ORDER — SODIUM CHLORIDE 0.9% FLUSH: 3.0000 mL | Freq: Once | INTRAVENOUS | Status: DC

## 2018-11-26 MED ORDER — CLONAZEPAM 0.5 MG PO TABS
0.5000 mg | ORAL_TABLET | Freq: Three times a day (TID) | ORAL | Status: DC
  Administered 2018-11-26: 0.5 mg via ORAL
  Filled 2018-11-26: qty 1

## 2018-11-26 MED ORDER — CIPROFLOXACIN HCL 500 MG PO TABS: 500.0000 mg | ORAL_TABLET | Freq: Once | ORAL | Status: DC

## 2018-11-26 MED ORDER — CARBAMAZEPINE ER 200 MG PO TB12
300.0000 mg | ORAL_TABLET | Freq: Two times a day (BID) | ORAL | Status: DC
  Filled 2018-11-26: qty 1

## 2018-11-26 MED ORDER — CARBAMAZEPINE ER 100 MG PO CP12
300.0000 mg | ORAL_CAPSULE | Freq: Two times a day (BID) | ORAL | Status: DC
  Filled 2018-11-26: qty 3
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
## 2018-11-26 NOTE — ED Notes (Signed)
[facility] taken to [facility] for [facility].

## 2018-11-26 NOTE — ED Notes (Signed)
Pt oob to bathroom with assist  
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
## 2018-11-26 NOTE — ED Notes (Signed)
Dinner tray ordered.
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
## 2018-11-26 NOTE — BHH Counselor (Signed)
  BH ASSESSMENT Disposition:   [facility] discussed case with [place] Provider, [name], NP who psychiatrically cleared patient.  Jinan Biggins L. [place], [place], [facility], [facility] Therapeutic Triage Specialist  [phone]
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
## 2018-11-26 NOTE — ED Notes (Addendum)
Pt eating [place] sandwhich and tolerating well. Pt ask for seconds and same given. Pt calm and cooperative.Denies pain.
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
## 2018-11-26 NOTE — Discharge Instructions (Addendum)

## 2018-11-27 NOTE — ED Notes (Signed)
Pt discharged earlier by Anderson Malta, RN.  Facility that pt lives at arranged for taxi to pick her up.

## 2021-07-06 ENCOUNTER — Other Ambulatory Visit: Payer: Self-pay | Admitting: Internal Medicine

## 2021-07-07 ENCOUNTER — Other Ambulatory Visit: Payer: Self-pay | Admitting: Nurse Practitioner

## 2021-07-07 DIAGNOSIS — Z1231 Encounter for screening mammogram for malignant neoplasm of breast: Secondary | ICD-10-CM

## 2021-10-25 ENCOUNTER — Ambulatory Visit
Admission: RE | Admit: 2021-10-25 | Discharge: 2021-10-25 | Disposition: A | Discharge status: Home / Self Care | Payer: Self-pay | Source: Ambulatory Visit | Attending: Nurse Practitioner | Admitting: Nurse Practitioner

## 2021-10-25 DIAGNOSIS — Z1231 Encounter for screening mammogram for malignant neoplasm of breast: Secondary | ICD-10-CM

## 2022-09-07 ENCOUNTER — Emergency Department (HOSPITAL_BASED_OUTPATIENT_CLINIC_OR_DEPARTMENT_OTHER): Payer: Medicaid Other

## 2022-09-07 ENCOUNTER — Emergency Department (HOSPITAL_BASED_OUTPATIENT_CLINIC_OR_DEPARTMENT_OTHER)
Admission: EM | Admit: 2022-09-07 | Discharge: 2022-09-07 | Disposition: A | Discharge status: Home / Self Care | Payer: Medicaid Other | Attending: Emergency Medicine | Admitting: Emergency Medicine

## 2022-09-07 ENCOUNTER — Emergency Department (HOSPITAL_BASED_OUTPATIENT_CLINIC_OR_DEPARTMENT_OTHER): Payer: Medicaid Other | Admitting: Radiology

## 2022-09-07 ENCOUNTER — Encounter (HOSPITAL_BASED_OUTPATIENT_CLINIC_OR_DEPARTMENT_OTHER): Payer: Self-pay | Admitting: Emergency Medicine

## 2022-09-07 ENCOUNTER — Other Ambulatory Visit: Payer: Self-pay

## 2022-09-07 DIAGNOSIS — R059 Cough, unspecified: Secondary | ICD-10-CM | POA: Diagnosis present

## 2022-09-07 DIAGNOSIS — R062 Wheezing: Secondary | ICD-10-CM | POA: Diagnosis not present

## 2022-09-07 DIAGNOSIS — Z1152 Encounter for screening for COVID-19: Secondary | ICD-10-CM | POA: Diagnosis not present

## 2022-09-07 DIAGNOSIS — R051 Acute cough: Secondary | ICD-10-CM | POA: Diagnosis not present

## 2022-09-07 DIAGNOSIS — W19XXXA Unspecified fall, initial encounter: Secondary | ICD-10-CM

## 2022-09-07 DIAGNOSIS — W1839XA Other fall on same level, initial encounter: Secondary | ICD-10-CM | POA: Insufficient documentation

## 2022-09-07 DIAGNOSIS — R42 Dizziness and giddiness: Secondary | ICD-10-CM | POA: Diagnosis not present

## 2022-09-07 LAB — URINALYSIS, ROUTINE W REFLEX MICROSCOPIC
Bilirubin Urine: NEGATIVE
Glucose, UA: NEGATIVE mg/dL
Hgb urine dipstick: NEGATIVE
Ketones, ur: NEGATIVE mg/dL
Leukocytes,Ua: NEGATIVE
Nitrite: NEGATIVE
Protein, ur: NEGATIVE mg/dL
Specific Gravity, Urine: 1.01 (ref 1.005–1.030)
pH: 5.5 (ref 5.0–8.0)

## 2022-09-07 LAB — CBC WITH DIFFERENTIAL/PLATELET
Abs Immature Granulocytes: 0.01 10*3/uL (ref 0.00–0.07)
Basophils Absolute: 0 10*3/uL (ref 0.0–0.1)
Basophils Relative: 0 %
Eosinophils Absolute: 0 10*3/uL (ref 0.0–0.5)
Eosinophils Relative: 0 %
HCT: 41.2 % (ref 36.0–46.0)
Hemoglobin: 14.1 g/dL (ref 12.0–15.0)
Immature Granulocytes: 0 %
Lymphocytes Relative: 20 %
Lymphs Abs: 1.3 10*3/uL (ref 0.7–4.0)
MCH: 30.7 pg (ref 26.0–34.0)
MCHC: 34.2 g/dL (ref 30.0–36.0)
MCV: 89.6 fL (ref 80.0–100.0)
Monocytes Absolute: 0.7 10*3/uL (ref 0.1–1.0)
Monocytes Relative: 11 %
Neutro Abs: 4.4 10*3/uL (ref 1.7–7.7)
Neutrophils Relative %: 69 %
Platelets: 154 10*3/uL (ref 150–400)
RBC: 4.6 MIL/uL (ref 3.87–5.11)
RDW: 11.9 % (ref 11.5–15.5)
WBC: 6.5 10*3/uL (ref 4.0–10.5)
nRBC: 0 % (ref 0.0–0.2)

## 2022-09-07 LAB — COMPREHENSIVE METABOLIC PANEL
ALT: 20 U/L (ref 0–44)
AST: 20 U/L (ref 15–41)
Albumin: 3.9 g/dL (ref 3.5–5.0)
Alkaline Phosphatase: 165 U/L — ABNORMAL HIGH (ref 38–126)
Anion gap: 9 (ref 5–15)
BUN: 14 mg/dL (ref 6–20)
CO2: 19 mmol/L — ABNORMAL LOW (ref 22–32)
Calcium: 9.1 mg/dL (ref 8.9–10.3)
Chloride: 101 mmol/L (ref 98–111)
Creatinine, Ser: 1.11 mg/dL — ABNORMAL HIGH (ref 0.44–1.00)
GFR, Estimated: 58 mL/min — ABNORMAL LOW (ref >60)
Glucose, Bld: 131 mg/dL — ABNORMAL HIGH (ref 70–99)
Potassium: 3.6 mmol/L (ref 3.5–5.1)
Sodium: 136 mmol/L (ref 135–145)
Total Bilirubin: 0.4 mg/dL (ref 0.3–1.2)
Total Protein: 6.9 g/dL (ref 6.5–8.1)

## 2022-09-07 LAB — LACTIC ACID, PLASMA: Lactic Acid, Venous: 1.4 mmol/L (ref 0.5–1.9)

## 2022-09-07 LAB — TROPONIN I (HIGH SENSITIVITY): Troponin I (High Sensitivity): 3 ng/L (ref <18)

## 2022-09-07 LAB — SARS CORONAVIRUS 2 BY RT PCR: SARS Coronavirus 2 by RT PCR: NEGATIVE

## 2022-09-07 MED ORDER — ALBUTEROL SULFATE HFA 108 (90 BASE) MCG/ACT IN AERS
2.0000 | INHALATION_SPRAY | Freq: Once | RESPIRATORY_TRACT | Status: AC
  Administered 2022-09-07: 2 via RESPIRATORY_TRACT
  Filled 2022-09-07: qty 6.7

## 2022-09-07 MED ORDER — AEROCHAMBER PLUS FLO-VU MEDIUM MISC
1.0000 | Freq: Once | Status: DC
  Filled 2022-09-07: qty 10

## 2022-09-07 MED ORDER — IPRATROPIUM-ALBUTEROL 0.5-2.5 (3) MG/3ML IN SOLN
3.0000 mL | Freq: Once | RESPIRATORY_TRACT | Status: AC
  Administered 2022-09-07: 3 mL via RESPIRATORY_TRACT
  Filled 2022-09-07: qty 3

## 2022-09-07 NOTE — ED Triage Notes (Signed)
Pt arrives pov with caregiver, to triage in wheelchair with c/o cough since yesterday, and feeling dizzy today. Caregiver states pt was on floor this morning. Denies hitting head.

## 2022-09-07 NOTE — Discharge Instructions (Addendum)
1.  Use the albuterol inhaler every 4 hours if needed for coughing and wheezing.  At this time your chest x-ray does not show a pneumonia and you do not have fever.  Your symptoms suggest started.  This might be a virus or allergy bronchitis.  Return to the emergency department or you see your doctor for recheck if you get a fever, chest pain or worsening symptoms. 2.  You fell and report pain in your buttocks and hips.  No fractures or showing up on x-ray.  Take extra strength Tylenol every 6 hours as needed for pain.  Follow-up with your doctor for recheck. 3.  You report feeling dizzy.  Be very careful with your movements.  Do not turn or stand quickly.  Return to emergency department if you get any bad headache, a focal area of weakness or numbness in arm or leg not working.  At this time it does not appear that you have had a stroke but if you develop any stroke symptoms return immediately.  See educational material about stroke in your discharge instructions.

## 2022-09-07 NOTE — ED Provider Notes (Signed)
[place] EMERGENCY DEPARTMENT AT [facility] Provider Note   CSN: [id number] Arrival date & time: 09/07/22  5784     History  Chief Complaint  Patient presents with   Cough    Evelyn Allen is a 57 y.o. female.  HPI  Patient has a caregiver for cognitive functional needs.  Patient caregiver reports at baseline the patient is independently ambulatory with a bit of a shuffling gait and leaning forward but steady.  This morning the caregiver found the patient sitting on the bathroom floor after she went in to brush her teeth.  The patient reports that she got dizzy and fell down on the floor landing on her butt.  Patient denies striking her head.  She denies headache.  She reports she did feel dizzy.  Caregiver reports she tried to assist her to walk and the patient was still off balance and holding onto things to walk.  Patient's caregiver reports that mental status is at baseline.  New symptom recently developed with a cough that began yesterday.  It got a lot worse today.  They have not documented a fever.  Patient denies that she is having chest pain but reports she does have a lot of coughing.  Patient reports it does hurt in her "butt" when she fell on the floor.  Patient does endorse feeling dizzy but does not give a lot of details.  Does not endorse any focal weakness numbness or tingling.    Home Medications Prior to Admission medications   Medication Sig Start Date End Date Taking? Authorizing Provider  FLUoxetine (PROZAC) 40 MG capsule  06/11/19  Yes [provider]  Vitamin D, Ergocalciferol, (DRISDOL) 1.25 MG (50000 UNIT) CAPS capsule Take 50,000 Units by mouth every 7 (seven) days. 06/08/20  Yes [provider]  Carbamazepine (EQUETRO) 300 MG CP12 Take 300 mg by mouth 2 (two) times a day.    [provider]  cetirizine (ZYRTEC) 10 MG tablet Take 10 mg by mouth at bedtime.     [provider]  Cholecalciferol (VITAMIN D) 50 MCG (2000  UT) tablet Take 2,000 Units by mouth 2 (two) times a day.    [provider]  ciprofloxacin (CIPRO) 500 MG tablet Take 1 tablet (500 mg total) by mouth 2 (two) times daily. 11/26/18   Jacalyn Lefevre, MD  clonazePAM (KLONOPIN) 0.5 MG tablet Take 0.5 mg by mouth 3 (three) times daily.  10/09/18   [provider]  guanFACINE (TENEX) 2 MG tablet Take 2 mg by mouth 2 (two) times a day.    [provider]  hydrOXYzine (ATARAX/VISTARIL) 25 MG tablet Take 25 mg by mouth 2 (two) times a day.  10/11/18   [provider]  Melatonin 3 MG CAPS Take 1 capsule by mouth at bedtime.     [provider]  OLANZapine (ZYPREXA) 5 MG tablet Take 5 mg by mouth 2 (two) times a day.    [provider]  pantoprazole (PROTONIX) 40 MG tablet Take 1 tablet (40 mg total) by mouth daily. 09/23/15   Gelene Mink, NP  ziprasidone (GEODON) 60 MG capsule Take 60 mg by mouth daily.    [provider]      Allergies    Penicillins    Review of Systems   Review of Systems  Physical Exam Updated Vital Signs BP (!) 113/50   Pulse 71   Temp 98.5 F (36.9 C) (Oral)   Resp 18   Wt 61.2  kg   SpO2 95%   BMI 24.69 kg/m  Physical Exam Constitutional:      Comments: Patient is alert.  No respiratory distress but intermittent coarse cough.  HENT:     Mouth/Throat:     Mouth: Mucous membranes are moist.     Pharynx: Oropharynx is clear.  Eyes:     Comments: Baseline patient has a disconjugate gaze.  Cardiovascular:     Rate and Rhythm: Normal rate and regular rhythm.  Pulmonary:     Comments: Patient does not have dyspnea at rest.  She does have harsh intermittent cough.  Extensive wheezing throughout both lung fields.  Adequate airflow to the bases. Abdominal:     General: There is no distension.     Palpations: Abdomen is soft.     Tenderness: There is no abdominal tenderness. There is no guarding.  Musculoskeletal:        General: Normal range of motion.      Cervical back: Neck supple.     Comments: No apparent extremity deformity or lack of range of motion.  Lying in the bed patient will independently elevate each lower extremity off of the bed and hold it up.  She does report pain however when elevating the right lower extremity.  No evident deformities.  She is using both upper extremities without limitation.  I did have the patient's stand and ambulate.  She ambulates with a slightly shuffling and forward bent gait but caregiver reports this is a baseline for her.  Skin:    General: Skin is warm and dry.  Neurological:     Comments: Patient has baseline cognitive deficit.  She is pleasantly interactive and answering questions situationally appropriate.  No focal gross motor deficit.  Patient will follow command to extend both arms and does not have apparent pronator drift.  She is a bit tremulous.  She will perform grip strength bilaterally which is symmetric.  Patient can elevate each lower extremity off of the bed and hold it.  She can also stand and independently ambulate with a slightly shuffling broad-based gait.  Psychiatric:        Mood and Affect: Mood normal.     ED Results / Procedures / Treatments   Labs (all labs ordered are listed, but only abnormal results are displayed) Labs Reviewed  COMPREHENSIVE METABOLIC PANEL - Abnormal; Notable for the following components:      Result Value   Glucose, Bld 131 (*)    Creatinine, Ser 1.11 (*)    Alkaline Phosphatase 165 (*)    GFR, Estimated 58 (*)    All other components within normal limits  SARS CORONAVIRUS 2 BY RT PCR  LACTIC ACID, PLASMA  CBC WITH DIFFERENTIAL/PLATELET  URINALYSIS, ROUTINE W REFLEX MICROSCOPIC  BRAIN NATRIURETIC PEPTIDE  TROPONIN I (HIGH SENSITIVITY)    EKG EKG Interpretation  Date/Time:  Wednesday Sep 07 2022 11:16:16 EDT Ventricular Rate:  78 PR Interval:  177 QRS Duration: 88 QT Interval:  398 QTC Calculation: 454 R Axis:   77 Text  Interpretation: Sinus rhythm normal, no sig change from previous Confirmed by Arby Barrette [phone]) on 09/07/2022 11:18:06 AM  Radiology DG Chest 2 View  Result Date: 09/07/2022 CLINICAL DATA:  Cough and wheezing.  Unwitnessed fall. EXAM: CHEST - 2 VIEW COMPARISON:  None Available. FINDINGS: Normal lung volumes. No focal consolidations. No pleural effusion or pneumothorax. The heart size and mediastinal contours are within normal limits. No acute osseous abnormality. IMPRESSION: 1. No active cardiopulmonary  disease. 2.  No radiographic finding of acute displaced fracture. Electronically Signed   By: Agustin Cree M.D.   On: 09/07/2022 12:19   CT Head Wo Contrast  Result Date: 09/07/2022 CLINICAL DATA:  Neuro deficit, acute, stroke suspected.  Dizziness. EXAM: CT HEAD WITHOUT CONTRAST TECHNIQUE: Contiguous axial images were obtained from the base of the skull through the vertex without intravenous contrast. RADIATION DOSE REDUCTION: This exam was performed according to the departmental dose-optimization program which includes automated exposure control, adjustment of the mA and/or kV according to patient size and/or use of iterative reconstruction technique. COMPARISON:  Head CT 12/05/2015. FINDINGS: Brain: No acute intracranial hemorrhage. Gray-white differentiation is preserved. No hydrocephalus or extra-axial collection. No mass effect or midline shift. Vascular: No hyperdense vessel or unexpected calcification. Skull: No calvarial fracture or suspicious bone lesion. Skull base is unremarkable. Sinuses/Orbits: Mild mucosal disease throughout the paranasal sinuses. Orbits are unremarkable. Mastoids are well aerated. Other: None. IMPRESSION: No acute intracranial abnormality. Electronically Signed   By: Orvan Falconer M.D.   On: 09/07/2022 12:16   DG Hip Unilat With Pelvis 2-3 Views Right  Result Date: 09/07/2022 CLINICAL DATA:  Right hip pain after fall. EXAM: DG HIP (WITH OR WITHOUT PELVIS) 2-3V RIGHT  COMPARISON:  None Available. FINDINGS: There is no evidence of hip fracture or dislocation. Mild osteophyte formation is seen involving the right hip. IMPRESSION: Mild degenerative joint disease of right hip. No acute abnormality seen. Electronically Signed   By: Lupita Raider M.D.   On: 09/07/2022 12:15    Procedures Procedures    Medications Ordered in ED Medications  albuterol (VENTOLIN HFA) 108 (90 Base) MCG/ACT inhaler 2 puff (has no administration in time range)  AeroChamber Plus Flo-Vu Medium MISC 1 each (has no administration in time range)  ipratropium-albuterol (DUONEB) 0.5-2.5 (3) MG/3ML nebulizer solution 3 mL (3 mLs Nebulization Given 09/07/22 1119)    ED Course/ Medical Decision Making/ A&P                             Medical Decision Making Amount and/or Complexity of Data Reviewed Labs: ordered. Radiology: ordered.  Risk Prescription drug management.   Is here with her caregiver.  She had an episode today whereby she reported feeling dizzy and was found sitting on her buttocks in the bathroom.  Patient reports that she fell onto her butt and denies she struck her head.  Patient has some cognitive impairment but is cooperative for exam and does not have any focal neurologic findings.  She is ambulating at her baseline which is a wide-based and slightly forward gait.  Will proceed with CT head for unwitnessed fall and evaluation for possible stroke.  On examination patient had extensive wheezing and had report of just onset of coughing since yesterday.  Symptoms might be associated with a viral illness.  Will also proceed with chest x-ray and lab work for possible pneumonia versus bronchitis.  Chest x-ray is grossly clear.  Patient does not have leukocytosis or fever.  At this time I have low suspicion for bacterial pneumonia.  She did have wheezing I will dispense albuterol inhaler with spacer for wheezing and we have carefully reviewed return precautions for fever, chest  pain, shortness of breath or confusion.  Patient has caregivers and home.  This time stable for discharge with close supervision and follow-up.        Final Clinical Impression(s) / ED Diagnoses Final diagnoses:  Dizzy  Fall, initial encounter  Acute cough  Wheeze    Rx / DC Orders ED Discharge Orders     None         Arby Barrette, MD 09/07/22 1600

## 2022-10-04 ENCOUNTER — Ambulatory Visit: Payer: Medicaid Other | Attending: Nurse Practitioner | Admitting: Physical Therapy

## 2022-10-04 ENCOUNTER — Other Ambulatory Visit: Payer: Self-pay

## 2022-10-04 ENCOUNTER — Encounter: Payer: Self-pay | Admitting: Physical Therapy

## 2022-10-04 DIAGNOSIS — M5459 Other low back pain: Secondary | ICD-10-CM | POA: Insufficient documentation

## 2022-10-04 DIAGNOSIS — R2689 Other abnormalities of gait and mobility: Secondary | ICD-10-CM | POA: Diagnosis present

## 2022-10-04 DIAGNOSIS — M6281 Muscle weakness (generalized): Secondary | ICD-10-CM | POA: Insufficient documentation

## 2022-10-04 NOTE — Therapy (Signed)
OUTPATIENT PHYSICAL THERAPY THORACOLUMBAR EVALUATION  Patient Name: Evelyn Allen MRN: [id number] DOB:Feb 20, 1966, 57 y.o., female Today's Date: 10/05/2022   PT End of Session - 10/05/22 1029     Visit Number 1    Number of Visits --   1-2x/week   Date for PT Re-Evaluation 11/30/22    Authorization Type MCD     PT Start Time 1700    PT Stop Time 1730    PT Time Calculation (min) 30 min             Past Medical History:  Diagnosis Date   Asthma    Chronic diarrhea    GERD (gastroesophageal reflux disease)    Mentally challenged    Schizophrenia [facility])    Past Surgical History:  Procedure Laterality Date   BIOPSY N/A 10/02/2014   Procedure: BIOPSY;  Surgeon: Corbin Ade, MD;  Location: [facility];  Service: Endoscopy;  Laterality: N/A;  Ascending/Descending/Sigmoid   COLONOSCOPY WITH PROPOFOL N/A 10/02/2014   RMR: normal   ESOPHAGEAL DILATION N/A 10/02/2014   Procedure: ESOPHAGEAL DILATION;  Surgeon: Corbin Ade, MD;  Location: [facility];  Service: Endoscopy;  Laterality: N/A;  [place] 55   ESOPHAGOGASTRODUODENOSCOPY (EGD) WITH PROPOFOL N/A 10/02/2014   RMR: normal s/p dilator   None     Patient Active Problem List   Diagnosis Date Noted   Developmental delay, moderate 10/18/2018   Schizophrenia (HCC)    Urinary tract infection without hematuria    Chronic diarrhea    Diverticulosis of colon without hemorrhage    Diarrhea 09/17/2014   Abdominal pain 09/17/2014   Dysphagia, pharyngoesophageal phase 09/17/2014    PCP: [facility]  REFERRING PROVIDER: [facility]  THERAPY DIAG:  Other low back pain  Muscle weakness  Other abnormalities of gait and mobility  REFERRING DIAG: Low back pain, unspecified [M54.50]   Rationale for Evaluation and Treatment:  Rehabilitation  SUBJECTIVE:  PERTINENT PAST HISTORY:  Schizophrenia, developmental delay      PRECAUTIONS: Fall  WEIGHT BEARING RESTRICTIONS No  FALLS:  Has patient fallen in last 6  months? Yes, Number of falls: falls forward for unknown reasons  MOI/History of condition:  Onset date: >4 years  SUBJECTIVE STATEMENT  Evelyn Allen is a 57 y.o. female who presents to clinic with chief complaint of low back pain which started greater than 4 years ago.  She is a poor historian  She is accompanied by her care taker which helps with her history.  The pain is the most bothersome when walking and standing and is improved with sitting.  She denies sxs in the lower extremities.   Red flags:  denies BB changes and saddle anesthesia  Pain:  Are you having pain? Yes Pain location: low back pain NPRS scale:  0/10 to 7/10 Aggravating factors: walking Relieving factors: sitting Pain description: sharp and aching Stage: Chronic Stability: staying the same   Occupation: none  Assistive Device: none used  Hand Dominance: NA  Patient Goals/Specific Activities: improve pain   OBJECTIVE:   DIAGNOSTIC FINDINGS:  Mild hip arthritis no imaging of lumbar spine  GENERAL OBSERVATION/GAIT:  Shuffling gait, forward flexed trunk  SENSATION:  Light touch: Appears intact  LUMBAR AROM  AROM AROM  10/05/2022  Flexion Fingertips to knees (limited by ~50%), w/ concordant pain  Extension limited by 75%, w/ concordant pain  Right lateral flexion limited by 25%  Left lateral flexion limited by 25%  Right rotation limited by 50%  Left  rotation limited by 50%    (Blank rows = not tested)   LE MMT:  MMT Right (Eval) Left (Eval)  Hip flexion (L2, L3) 4+ 4  Knee extension (L3) 4 4  Knee flexion 3+ 3+  Hip abduction 3+ 3+  Hip extension    Hip external rotation    Hip internal rotation    Hip adduction    Ankle dorsiflexion (L4)    Ankle plantarflexion (S1)    Ankle inversion    Ankle eversion    Great Toe ext (L5)    Grossly     (Blank rows = not tested, score listed is out of 5 possible points.  N = WNL, D = diminished, C = clear for gross weakness with myotome  testing, * = concordant pain with testing)   LUMBAR SPECIAL TESTS:  Straight leg raise: L (-), R (-) Slump: L (-), R (-)  MUSCLE LENGTH: Hamstrings: Right significant restriction; Left significant restriction Maisie Fus test: Right significant restriction; Left significant restriction  LE ROM:  ROM Right (Eval) Left (Eval)  Hip flexion    Hip extension Lacking 20 degrees Lacking 20 degrees  Hip abduction    Hip adduction    Hip internal rotation    Hip external rotation    Knee flexion    Knee extension    Ankle dorsiflexion    Ankle plantarflexion    Ankle inversion    Ankle eversion      (Blank rows = not tested, N = WNL, * = concordant pain with testing)  Functional Tests  Eval (10/05/2022)    Sustained supine bridge (dominant leg extended at 120'', if reached): 20'' (norm 170'')                                                            PALPATION:   TTP lumbar spine  PATIENT SURVEYS:  None taken d/t cognitive defict   TODAY'S TREATMENT  Creating, reviewing, and completing below HEP  PATIENT EDUCATION:  POC, diagnosis, prognosis, HEP, and outcome measures.  Pt educated via explanation, demonstration, and handout (HEP).  Pt confirms understanding verbally.   HOME EXERCISE PROGRAM: Access Code: [email or web address] URL: [email or web address] Date: 10/05/2022 Prepared by: Alphonzo Severance  Exercises - Modified Thomas Stretch  - 1 x daily - 7 x weekly - 1 sets - 2 reps - 45 seonds hold - Supine Bridge  - 1 x daily - 7 x weekly - 3 sets - 10 reps - Supine Lower Trunk Rotation  - 1 x daily - 7 x weekly - 1 sets - 20 reps - 3 hold - Hooklying Clamshell with Resistance  - 1 x daily - 7 x weekly - 3 sets - 10 reps  Treatment priorities   Eval        Hip ext ROM        General hip and LE strengthening        gait                          ASSESSMENT:  CLINICAL IMPRESSION: Evelyn Allen is a 57 y.o. female who presents to clinic with signs and  sxs consistent with low back pain.  Appears mechanical in nature.  History is complicated by pts cognitive deficit but caregiver  helps fill in gaps.  No clear signs of radiculopathy. Significant hip flexor tightness leading to forward flexed posture in gait and increased load on lumbar spine.  Prolonged periods of sitting may contribute to this.  Outcome measure not used d/t cognitive deficit.  OBJECTIVE IMPAIRMENTS: Pain, posture, gait, hip ROM, LE and global strength  ACTIVITY LIMITATIONS: walking, standing, lifting, bending  PERSONAL FACTORS: See medical history and pertinent history   REHAB POTENTIAL: Fair chronic condition  CLINICAL DECISION MAKING: Stable/uncomplicated  EVALUATION COMPLEXITY: Low   GOALS:   SHORT TERM GOALS: Target date: 11/02/2022   Amanda-Lee will be >75% HEP compliant to improve carryover between sessions and facilitate independent management of condition  Evaluation: ongoing Goal status: INITIAL   LONG TERM GOALS: Target date: 11/30/2022   Roshini will self report >/= 50% decrease in pain from evaluation   Evaluation/Baseline: 7/10 max pain Goal status: INITIAL    2.  Ashleyrose will improve the following MMTs to >/= 4/5 to show improvement in strength:     Evaluation/Baseline: see chart in note  LE MMT:  MMT Right (Eval) Left (Eval)  Hip flexion (L2, L3) 4+ 4  Knee extension (L3) 4 4  Knee flexion 3+ 3+  Hip abduction 3+ 3+  Hip extension    Hip external rotation    Hip internal rotation    Hip adduction    Ankle dorsiflexion (L4)    Ankle plantarflexion (S1)    Ankle inversion    Ankle eversion    Great Toe ext (L5)    Grossly     (Blank rows = not tested, score listed is out of 5 possible points.  N = WNL, D = diminished, C = clear for gross weakness with myotome testing, * = concordant pain with testing)  Goal status: INITIAL   3.  Merav will be able to maintain supine bridge for 40'' (dominant leg extended if 120''  reached) as evidence of improved hip extension and core strength (norm for healthy adult ~170'')   Evaluation/Baseline: 20'' Goal status: INITIAL   4.  Leronda will improve hip ext ROM to neutral  Evaluation/Baseline: limited by 20 degrees Goal status: INITIAL   5.  Tytianna will express significant improvement in ability to walk, not limited by low back pain  Evaluation/Baseline: limited Goal status: INITIAL   PLAN: PT FREQUENCY: 1-2x/week  PT DURATION: 8 weeks  PLANNED INTERVENTIONS: Therapeutic exercises, Aquatic therapy, Therapeutic activity, Neuro Muscular re-education, Gait training, Patient/Family education, Joint mobilization, Dry Needling, Electrical stimulation, Spinal mobilization and/or manipulation, Moist heat, Taping, Vasopneumatic device, Ionotophoresis 4mg /ml Dexamethasone, and Manual therapy   Alphonzo Severance PT, DPT 10/05/2022, 10:37 AM

## 2022-10-20 ENCOUNTER — Ambulatory Visit: Payer: Medicaid Other

## 2022-10-25 ENCOUNTER — Ambulatory Visit: Payer: Medicaid Other | Admitting: Physical Therapy

## 2022-10-25 ENCOUNTER — Encounter: Payer: Self-pay | Admitting: Physical Therapy

## 2022-10-25 DIAGNOSIS — R2689 Other abnormalities of gait and mobility: Secondary | ICD-10-CM

## 2022-10-25 DIAGNOSIS — M5459 Other low back pain: Secondary | ICD-10-CM

## 2022-10-25 DIAGNOSIS — M6281 Muscle weakness (generalized): Secondary | ICD-10-CM

## 2022-10-25 NOTE — Therapy (Signed)
Daily Note  Patient Name: Evelyn Allen MRN: [id number] DOB:[birth year], 57 y.o., female Today's Date: 10/25/2022   PT End of Session - 10/25/22 1703     Visit Number 2    Number of Visits 4   1-2x/week   Date for PT Re-Evaluation 11/30/22    Authorization Type [facility] [place]    Authorization Time Period Approved initial 3 visits 10/17/22-11/06/22    PT Start Time 1702    PT Stop Time 1742    PT Time Calculation (min) 40 min             Past Medical History:  Diagnosis Date   Asthma    Chronic diarrhea    GERD (gastroesophageal reflux disease)    Mentally challenged    Schizophrenia [facility])    Past Surgical History:  Procedure Laterality Date   BIOPSY N/A 10/02/2014   Procedure: BIOPSY;  Surgeon: Corbin Ade, MD;  Location: [facility];  Service: Endoscopy;  Laterality: N/A;  Ascending/Descending/Sigmoid   COLONOSCOPY WITH PROPOFOL N/A 10/02/2014   RMR: normal   ESOPHAGEAL DILATION N/A 10/02/2014   Procedure: ESOPHAGEAL DILATION;  Surgeon: Corbin Ade, MD;  Location: [facility];  Service: Endoscopy;  Laterality: N/A;  [place] 55   ESOPHAGOGASTRODUODENOSCOPY (EGD) WITH PROPOFOL N/A 10/02/2014   RMR: normal s/p dilator   None     Patient Active Problem List   Diagnosis Date Noted   Developmental delay, moderate 10/18/2018   Schizophrenia (HCC)    Urinary tract infection without hematuria    Chronic diarrhea    Diverticulosis of colon without hemorrhage    Diarrhea 09/17/2014   Abdominal pain 09/17/2014   Dysphagia, pharyngoesophageal phase 09/17/2014    [facility]  REFERRING PROVIDER: [facility]  THERAPY DIAG:  Other low back pain  Other abnormalities of gait and mobility  Muscle weakness  REFERRING DIAG: Low back pain, unspecified [M54.50]   Rationale for Evaluation and Treatment:  Rehabilitation  SUBJECTIVE:  PERTINENT PAST HISTORY:  Schizophrenia, developmental delay      PRECAUTIONS: Fall  WEIGHT BEARING RESTRICTIONS  No  FALLS:  Has patient fallen in last 6 months? Yes, Number of falls: falls forward for unknown reasons  MOI/History of condition:  Onset date: >4 years  SUBJECTIVE STATEMENT  Pt report that her back pain is about the same.  She has not been HEP compliant   Red flags:  denies BB changes and saddle anesthesia  Pain:  Are you having pain? Yes Pain location: low back pain NPRS scale:  0/10 to 7/10 Aggravating factors: walking Relieving factors: sitting Pain description: sharp and aching Stage: Chronic Stability: staying the same   Occupation: none  Assistive Device: none used  Hand Dominance: NA  Patient Goals/Specific Activities: improve pain   OBJECTIVE:   DIAGNOSTIC FINDINGS:  Mild hip arthritis no imaging of lumbar spine  GENERAL OBSERVATION/GAIT:  Shuffling gait, forward flexed trunk  SENSATION:  Light touch: Appears intact  LUMBAR AROM  AROM AROM  10/25/2022  Flexion Fingertips to knees (limited by ~50%), w/ concordant pain  Extension limited by 75%, w/ concordant pain  Right lateral flexion limited by 25%  Left lateral flexion limited by 25%  Right rotation limited by 50%  Left rotation limited by 50%    (Blank rows = not tested)   LE MMT:  MMT Right (Eval) Left (Eval)  Hip flexion (L2, L3) 4+ 4  Knee extension (L3) 4 4  Knee flexion 3+ 3+  Hip abduction 3+  3+  Hip extension    Hip external rotation    Hip internal rotation    Hip adduction    Ankle dorsiflexion (L4)    Ankle plantarflexion (S1)    Ankle inversion    Ankle eversion    Great Toe ext (L5)    Grossly     (Blank rows = not tested, score listed is out of 5 possible points.  N = WNL, D = diminished, C = clear for gross weakness with myotome testing, * = concordant pain with testing)   LUMBAR SPECIAL TESTS:  Straight leg raise: L (-), R (-) Slump: L (-), R (-)  MUSCLE LENGTH: Hamstrings: Right significant restriction; Left significant restriction Maisie Fus test: Right  significant restriction; Left significant restriction  LE ROM:  ROM Right (Eval) Left (Eval)  Hip flexion    Hip extension Lacking 20 degrees Lacking 20 degrees  Hip abduction    Hip adduction    Hip internal rotation    Hip external rotation    Knee flexion    Knee extension    Ankle dorsiflexion    Ankle plantarflexion    Ankle inversion    Ankle eversion      (Blank rows = not tested, N = WNL, * = concordant pain with testing)  Functional Tests  Eval (10/25/2022)    Sustained supine bridge (dominant leg extended at 120'', if reached): 20'' (norm 170'')                                                            PALPATION:   TTP lumbar spine  PATIENT SURVEYS:  None taken d/t cognitive defict   TODAY'S TREATMENT  [facility] Adult PT Treatment:  Therapeutic Exercise: nu-step L5 1m while taking subjective and planning session with patient Hip flexor stretch - 1.5' ea LTR - 20x Clam shell - Blue TB - 3x10 Hip adduction 5'' squeeze - 10x Bridge - 4x10 SLR - 2x10 ea - heavy cuing to keep knee extended  STS with OH press  HOME EXERCISE PROGRAM: Access Code: [email or web address] URL: [email or web address] Date: 10/05/2022 Prepared by: Alphonzo Severance  Exercises - Modified Thomas Stretch  - 1 x daily - 7 x weekly - 1 sets - 2 reps - 45 seonds hold - Supine Bridge  - 1 x daily - 7 x weekly - 3 sets - 10 reps - Supine Lower Trunk Rotation  - 1 x daily - 7 x weekly - 1 sets - 20 reps - 3 hold - Hooklying Clamshell with Resistance  - 1 x daily - 7 x weekly - 3 sets - 10 reps  Treatment priorities   Eval        Hip ext ROM        General hip and LE strengthening        gait                          ASSESSMENT:  CLINICAL IMPRESSION: Dawna tolerated session well with no adverse reaction.  Concentrated on hip ext ROM followed by hip ext strengthening to good effect. She requires heavy cuing for form and pacing throughout.  She reports improvement  in sxs following therapy.  Encouraged exercise and HEP compliance at home to pt and caregiver.  OBJECTIVE IMPAIRMENTS: Pain, posture, gait, hip ROM, LE and global strength  ACTIVITY LIMITATIONS: walking, standing, lifting, bending  PERSONAL FACTORS: See medical history and pertinent history   REHAB POTENTIAL: Fair chronic condition  CLINICAL DECISION MAKING: Stable/uncomplicated  EVALUATION COMPLEXITY: Low   GOALS:   SHORT TERM GOALS: Target date: 11/02/2022   Alizza will be >75% HEP compliant to improve carryover between sessions and facilitate independent management of condition  Evaluation: ongoing Goal status: INITIAL   LONG TERM GOALS: Target date: 11/30/2022   Hayla will self report >/= 50% decrease in pain from evaluation   Evaluation/Baseline: 7/10 max pain Goal status: INITIAL    2.  Tineshia will improve the following MMTs to >/= 4/5 to show improvement in strength:     Evaluation/Baseline: see chart in note  LE MMT:  MMT Right (Eval) Left (Eval)  Hip flexion (L2, L3) 4+ 4  Knee extension (L3) 4 4  Knee flexion 3+ 3+  Hip abduction 3+ 3+  Hip extension    Hip external rotation    Hip internal rotation    Hip adduction    Ankle dorsiflexion (L4)    Ankle plantarflexion (S1)    Ankle inversion    Ankle eversion    Great Toe ext (L5)    Grossly     (Blank rows = not tested, score listed is out of 5 possible points.  N = WNL, D = diminished, C = clear for gross weakness with myotome testing, * = concordant pain with testing)  Goal status: INITIAL   3.  Ernestine will be able to maintain supine bridge for 40'' (dominant leg extended if 120'' reached) as evidence of improved hip extension and core strength (norm for healthy adult ~170'')   Evaluation/Baseline: 20'' Goal status: INITIAL   4.  Maple will improve hip ext ROM to neutral  Evaluation/Baseline: limited by 20 degrees Goal status: INITIAL   5.  Kayelee will express  significant improvement in ability to walk, not limited by low back pain  Evaluation/Baseline: limited Goal status: INITIAL   PLAN: PT FREQUENCY: 1-2x/week  PT DURATION: 8 weeks  PLANNED INTERVENTIONS: Therapeutic exercises, Aquatic therapy, Therapeutic activity, Neuro Muscular re-education, Gait training, Patient/Family education, Joint mobilization, Dry Needling, Electrical stimulation, Spinal mobilization and/or manipulation, Moist heat, Taping, Vasopneumatic device, Ionotophoresis 4mg /ml Dexamethasone, and Manual therapy   Alphonzo Severance PT, DPT 10/25/2022, 5:43 PM

## 2022-11-01 ENCOUNTER — Ambulatory Visit: Payer: MEDICAID | Attending: Nurse Practitioner

## 2022-11-01 DIAGNOSIS — R2689 Other abnormalities of gait and mobility: Secondary | ICD-10-CM | POA: Diagnosis present

## 2022-11-01 DIAGNOSIS — M6281 Muscle weakness (generalized): Secondary | ICD-10-CM | POA: Diagnosis present

## 2022-11-01 DIAGNOSIS — M5459 Other low back pain: Secondary | ICD-10-CM | POA: Diagnosis present

## 2022-11-01 NOTE — Therapy (Signed)
Daily Note  Patient Name: Evelyn Allen MRN: [id number] DOB:[birth year], 57 y.o., female Today's Date: 11/01/2022   PT End of Session - 11/01/22 1704     Visit Number 3    Number of Visits 4    Date for PT Re-Evaluation 11/30/22    PT Start Time 1705    PT Stop Time 1743    PT Time Calculation (min) 38 min    Activity Tolerance Patient tolerated treatment well    Behavior During Therapy [facility] for tasks assessed/performed              Past Medical History:  Diagnosis Date   Asthma    Chronic diarrhea    GERD (gastroesophageal reflux disease)    Mentally challenged    Schizophrenia [facility])    Past Surgical History:  Procedure Laterality Date   BIOPSY N/A 10/02/2014   Procedure: BIOPSY;  Surgeon: Corbin Ade, MD;  Location: [facility];  Service: Endoscopy;  Laterality: N/A;  Ascending/Descending/Sigmoid   COLONOSCOPY WITH PROPOFOL N/A 10/02/2014   RMR: normal   ESOPHAGEAL DILATION N/A 10/02/2014   Procedure: ESOPHAGEAL DILATION;  Surgeon: Corbin Ade, MD;  Location: [facility];  Service: Endoscopy;  Laterality: N/A;  [place] 55   ESOPHAGOGASTRODUODENOSCOPY (EGD) WITH PROPOFOL N/A 10/02/2014   RMR: normal s/p dilator   None     Patient Active Problem List   Diagnosis Date Noted   Developmental delay, moderate 10/18/2018   Schizophrenia (HCC)    Urinary tract infection without hematuria    Chronic diarrhea    Diverticulosis of colon without hemorrhage    Diarrhea 09/17/2014   Abdominal pain 09/17/2014   Dysphagia, pharyngoesophageal phase 09/17/2014    [facility]P: [facility]  REFERRING PROVIDER: [facility]  THERAPY DIAG:  Other low back pain  Other abnormalities of gait and mobility  Muscle weakness  REFERRING DIAG: Low back pain, unspecified [M54.50]   Rationale for Evaluation and Treatment:  Rehabilitation  SUBJECTIVE:  PERTINENT PAST HISTORY:  Schizophrenia, developmental delay      PRECAUTIONS: Fall  WEIGHT BEARING RESTRICTIONS  No  FALLS:  Has patient fallen in last 6 months? Yes, Number of falls: falls forward for unknown reasons  MOI/History of condition:  Onset date: >4 years  SUBJECTIVE STATEMENT  Patient reporting no pain today. She accompanied by her caregiver who reports that they are somewhat keeping up with her home exercises.    Red flags:  denies BB changes and saddle anesthesia  Pain:  Are you having pain? Yes Pain location: low back pain NPRS scale:  0/10 to 7/10 Aggravating factors: walking Relieving factors: sitting Pain description: sharp and aching Stage: Chronic Stability: staying the same   Occupation: none  Assistive Device: none used  Hand Dominance: NA  Patient Goals/Specific Activities: improve pain   OBJECTIVE:   DIAGNOSTIC FINDINGS:  Mild hip arthritis no imaging of lumbar spine  GENERAL OBSERVATION/GAIT:  Shuffling gait, forward flexed trunk  SENSATION:  Light touch: Appears intact  LUMBAR AROM  AROM AROM  Eval  Flexion Fingertips to knees (limited by ~50%), w/ concordant pain  Extension limited by 75%, w/ concordant pain  Right lateral flexion limited by 25%  Left lateral flexion limited by 25%  Right rotation limited by 50%  Left rotation limited by 50%    (Blank rows = not tested)   LE MMT:  MMT Right (Eval) Left (Eval)  Hip flexion (L2, L3) 4+ 4  Knee extension (L3) 4 4  Knee flexion  3+ 3+  Hip abduction 3+ 3+  Hip extension    Hip external rotation    Hip internal rotation    Hip adduction    Ankle dorsiflexion (L4)    Ankle plantarflexion (S1)    Ankle inversion    Ankle eversion    Great Toe ext (L5)    Grossly     (Blank rows = not tested, score listed is out of 5 possible points.  N = WNL, D = diminished, C = clear for gross weakness with myotome testing, * = concordant pain with testing)   LUMBAR SPECIAL TESTS:  Straight leg raise: L (-), R (-) Slump: L (-), R (-)  MUSCLE LENGTH: Hamstrings: Right significant  restriction; Left significant restriction Maisie Fus test: Right significant restriction; Left significant restriction  LE ROM:  ROM Right (Eval) Left (Eval)  Hip flexion    Hip extension Lacking 20 degrees Lacking 20 degrees  Hip abduction    Hip adduction    Hip internal rotation    Hip external rotation    Knee flexion    Knee extension    Ankle dorsiflexion    Ankle plantarflexion    Ankle inversion    Ankle eversion      (Blank rows = not tested, N = WNL, * = concordant pain with testing)  Functional Tests  Eval    Sustained supine bridge (dominant leg extended at 120'', if reached): 20'' (norm 170'')                                                            PALPATION:   TTP lumbar spine  PATIENT SURVEYS:  None taken d/t cognitive defict   TODAY'S TREATMENT   [facility] Adult PT Treatment:                                                DATE: 11/01/2022  Therapeutic Exercise:  nu-step L3 27m while taking subjective and planning session with patient LTR - 20x Clam shell - Blue TB - 3x10 Hip adduction 5'' squeeze - 10x Bridge, blue TB above knee to prevent knee valgus- 4x10 SLR - 2x10 ea - heavy cuing to keep L knee extended \ Hamstring stretch, 4 x 15 sec BIL  STS with OH press with ball x 15  [facility] Adult PT Treatment:  Therapeutic Exercise:  nu-step L5 10m while taking subjective and planning session with patient Hip flexor stretch - 1.5' ea LTR - 20x Clam shell - Blue TB - 3x10 Hip adduction 5'' squeeze - 10x Bridge - 4x10 SLR - 2x10 ea - heavy cuing to keep knee extended  STS with OH press  HOME EXERCISE PROGRAM: Access Code: [email or web address] URL: [email or web address] Date: 10/05/2022 Prepared by: Alphonzo Severance  Exercises - Modified Thomas Stretch  - 1 x daily - 7 x weekly - 1 sets - 2 reps - 45 seonds hold - Supine Bridge  - 1 x daily - 7 x weekly - 3 sets - 10 reps - Supine Lower Trunk Rotation  - 1 x daily - 7 x weekly - 1  sets - 20 reps - 3 hold - Hooklying Clamshell with Resistance  -  1 x daily - 7 x weekly - 3 sets - 10 reps  Treatment priorities   Eval        Hip ext ROM        General hip and LE strengthening        gait                          ASSESSMENT:  CLINICAL IMPRESSION: Kyi appears to be limited with SLR due to hamstring tightness and quad weakness. Initiated hamstring stretch today and plan to incorporate SAQ at next visit to improve muscle activation and eccentric control. We will continue to progress with hip strengthening and gait activities as appropriate.      OBJECTIVE IMPAIRMENTS: Pain, posture, gait, hip ROM, LE and global strength  ACTIVITY LIMITATIONS: walking, standing, lifting, bending  PERSONAL FACTORS: See medical history and pertinent history   REHAB POTENTIAL: Fair chronic condition  CLINICAL DECISION MAKING: Stable/uncomplicated  EVALUATION COMPLEXITY: Low   GOALS:   SHORT TERM GOALS: Target date: 11/02/2022   Livana will be >75% HEP compliant to improve carryover between sessions and facilitate independent management of condition  Evaluation: ongoing Goal status: INITIAL   LONG TERM GOALS: Target date: 11/30/2022   Adilyn will self report >/= 50% decrease in pain from evaluation   Evaluation/Baseline: 7/10 max pain Goal status: INITIAL    2.  Allanna will improve the following MMTs to >/= 4/5 to show improvement in strength:     Evaluation/Baseline: see chart in note  LE MMT:  MMT Right (Eval) Left (Eval)  Hip flexion (L2, L3) 4+ 4  Knee extension (L3) 4 4  Knee flexion 3+ 3+  Hip abduction 3+ 3+  Hip extension    Hip external rotation    Hip internal rotation    Hip adduction    Ankle dorsiflexion (L4)    Ankle plantarflexion (S1)    Ankle inversion    Ankle eversion    Great Toe ext (L5)    Grossly     (Blank rows = not tested, score listed is out of 5 possible points.  N = WNL, D = diminished, C = clear for  gross weakness with myotome testing, * = concordant pain with testing)  Goal status: INITIAL   3.  Jennilee will be able to maintain supine bridge for 40'' (dominant leg extended if 120'' reached) as evidence of improved hip extension and core strength (norm for healthy adult ~170'')   Evaluation/Baseline: 20'' Goal status: INITIAL   4.  Myya will improve hip ext ROM to neutral  Evaluation/Baseline: limited by 20 degrees Goal status: INITIAL   5.  Regine will express significant improvement in ability to walk, not limited by low back pain  Evaluation/Baseline: limited Goal status: INITIAL   PLAN: PT FREQUENCY: 1-2x/week  PT DURATION: 8 weeks  PLANNED INTERVENTIONS: Therapeutic exercises, Aquatic therapy, Therapeutic activity, Neuro Muscular re-education, Gait training, Patient/Family education, Joint mobilization, Dry Needling, Electrical stimulation, Spinal mobilization and/or manipulation, Moist heat, Taping, Vasopneumatic device, Ionotophoresis 4mg /ml Dexamethasone, and Manual therapy   Mauri Reading PT, DPT 11/01/2022, 6:42 PM

## 2022-11-15 ENCOUNTER — Ambulatory Visit: Payer: MEDICAID

## 2022-11-15 DIAGNOSIS — R2689 Other abnormalities of gait and mobility: Secondary | ICD-10-CM

## 2022-11-15 DIAGNOSIS — M5459 Other low back pain: Secondary | ICD-10-CM

## 2022-11-15 DIAGNOSIS — M6281 Muscle weakness (generalized): Secondary | ICD-10-CM

## 2022-11-15 NOTE — Therapy (Signed)
PHYSICAL THERAPY DISCHARGE SUMMARY  Visits from Start of Care: 4  Current functional level related to goals / functional outcomes: See objective findings/assessment   Remaining deficits: See objective findings/assessment    Education / Equipment: See today's treatment/assessment    Patient agrees to discharge. Patient goals were not met. Patient is being discharged due to maximized rehab potential.    Daily Note  Patient Name: Evelyn Allen MRN: [id number] DOB:[birth year], 57 y.o., female Today's Date: 11/15/2022   PT End of Session - 11/15/22 1703     Visit Number 4    Number of Visits 4    Authorization Type [employer] [place]    PT Start Time 1705    PT Stop Time 1745    PT Time Calculation (min) 40 min    Activity Tolerance Patient tolerated treatment well    Behavior During Therapy WFL for tasks assessed/performed               Past Medical History:  Diagnosis Date   Asthma    Chronic diarrhea    GERD (gastroesophageal reflux disease)    Mentally challenged    Schizophrenia [facility])    Past Surgical History:  Procedure Laterality Date   BIOPSY N/A 10/02/2014   Procedure: BIOPSY;  Surgeon: Corbin Ade, MD;  Location: [facility];  Service: Endoscopy;  Laterality: N/A;  Ascending/Descending/Sigmoid   COLONOSCOPY WITH PROPOFOL N/A 10/02/2014   RMR: normal   ESOPHAGEAL DILATION N/A 10/02/2014   Procedure: ESOPHAGEAL DILATION;  Surgeon: Corbin Ade, MD;  Location: [facility];  Service: Endoscopy;  Laterality: N/A;  [place] [address]   ESOPHAGOGASTRODUODENOSCOPY (EGD) WITH PROPOFOL N/A 10/02/2014   RMR: normal s/p dilator   None     Patient Active Problem List   Diagnosis Date Noted   Developmental delay, moderate 10/18/2018   Schizophrenia (HCC)    Urinary tract infection without hematuria    Chronic diarrhea    Diverticulosis of colon without hemorrhage    Diarrhea 09/17/2014   Abdominal pain 09/17/2014   Dysphagia, pharyngoesophageal phase 09/17/2014    PCP: [facility]  REFERRING PROVIDER: [facility]  THERAPY DIAG:  Other low back pain  Other abnormalities of gait and mobility  Muscle weakness  REFERRING DIAG: Low back pain, unspecified [M54.50]   Rationale for Evaluation and Treatment:  Rehabilitation  SUBJECTIVE:  PERTINENT PAST HISTORY:  Schizophrenia, developmental delay      PRECAUTIONS: Fall  WEIGHT BEARING RESTRICTIONS No  FALLS:  Has patient fallen in last 6 months? Yes, Number of falls: falls forward for unknown reasons  MOI/History of condition:  Onset date: >4 years  SUBJECTIVE STATEMENT  Patient is accompanied by caregiver. They report that patient is walking at home, but not performing home exercises d/t pt reporting pain.    Red flags:  denies BB changes and saddle anesthesia  Pain:  Are you having pain? Yes Pain location: low back pain NPRS scale:  0/10 to 7/10 Aggravating factors: walking Relieving factors: sitting Pain description: sharp and aching Stage: Chronic Stability: staying the same   Occupation: none  Assistive Device: none used  Hand Dominance: NA  Patient Goals/Specific Activities: improve pain   OBJECTIVE:   DIAGNOSTIC FINDINGS:  Mild hip arthritis no imaging of lumbar spine  GENERAL OBSERVATION/GAIT:  Shuffling gait, forward flexed trunk  SENSATION:  Light touch: Appears intact  LUMBAR AROM  AROM AROM  Eval  Flexion Fingertips to knees (limited by ~50%), w/ concordant pain  Extension limited  by 75%, w/ concordant pain  Right lateral flexion limited by 25%  Left lateral flexion limited by 25%  Right rotation limited by 50%  Left rotation limited by 50%    (Blank rows = not tested)   LE MMT:  MMT Right (Eval) Left (Eval) Right 11/15/22 Left 11/15/22  Hip flexion (L2, L3) 4+ 4 4+ 4+  Knee extension (L3) 4 4 4 4   Knee flexion 3+ 3+    Hip abduction 3+ 3+    Hip extension      Hip external rotation      Hip internal rotation      Hip  adduction      Ankle dorsiflexion (L4)      Ankle plantarflexion (S1)      Ankle inversion      Ankle eversion      Great Toe ext (L5)      Grossly       (Blank rows = not tested, score listed is out of 5 possible points.  N = WNL, D = diminished, C = clear for gross weakness with myotome testing, * = concordant pain with testing)   LUMBAR SPECIAL TESTS:  Straight leg raise: L (-), R (-) Slump: L (-), R (-)  MUSCLE LENGTH: Hamstrings: Right significant restriction; Left significant restriction Maisie Fus test: Right significant restriction; Left significant restriction  LE ROM:  ROM Right (Eval) Left (Eval)  Hip flexion    Hip extension Lacking 20 degrees Lacking 20 degrees  Hip abduction    Hip adduction    Hip internal rotation    Hip external rotation    Knee flexion    Knee extension    Ankle dorsiflexion    Ankle plantarflexion    Ankle inversion    Ankle eversion      (Blank rows = not tested, N = WNL, * = concordant pain with testing)  Functional Tests  Eval    Sustained supine bridge (dominant leg extended at 120'', if reached): 20'' (norm 170'')                                                            PALPATION:   TTP lumbar spine  PATIENT SURVEYS:  None taken d/t cognitive defict   TODAY'S TREATMENT   [facility] Adult PT Treatment:                                                DATE: 11/15/2022  Therapeutic Exercise: nu-step L3-5 67m while taking subjective and planning session with patient LTR - 10x Hip adduction 5'' squeeze - 10x SLR - x10 ea - heavy cuing to keep L knee extended  Hamstring stretch, 3 x 10 sec BIL with manual assistance STS with OH press with ball x 15  Therapeutic Activity:  Reassessment of objective measures and subjective assessment regarding progress towards established goals and plan for independence with prescribed home program following discharged from PT     [facility] Adult PT Treatment:  DATE: 11/01/2022  Therapeutic Exercise:  nu-step L3 68m while taking subjective and planning session with patient LTR - 20x Clam shell - Blue TB - 3x10 Hip adduction 5'' squeeze - 10x Bridge, blue TB above knee to prevent knee valgus- 4x10 SLR - 2x10 ea - heavy cuing to keep L knee extended \ Hamstring stretch, 4 x 15 sec BIL  STS with OH press with ball x 15  [facility] Adult PT Treatment:  Therapeutic Exercise:  nu-step L5 86m while taking subjective and planning session with patient Hip flexor stretch - 1.5' ea LTR - 20x Clam shell - Blue TB - 3x10 Hip adduction 5'' squeeze - 10x Bridge - 4x10 SLR - 2x10 ea - heavy cuing to keep knee extended  STS with OH press  HOME EXERCISE PROGRAM: Access Code: [email or web address] URL: [email or web address] Date: 10/05/2022 Prepared by: Alphonzo Severance  Exercises - Modified Thomas Stretch  - 1 x daily - 7 x weekly - 1 sets - 2 reps - 45 seonds hold - Supine Bridge  - 1 x daily - 7 x weekly - 3 sets - 10 reps - Supine Lower Trunk Rotation  - 1 x daily - 7 x weekly - 1 sets - 20 reps - 3 hold - Hooklying Clamshell with Resistance  - 1 x daily - 7 x weekly - 3 sets - 10 reps  Treatment priorities   Eval        Hip ext ROM        General hip and LE strengthening        gait                          ASSESSMENT:  CLINICAL IMPRESSION: Patient has attended 4 total PT sessions. She has made some progress with hip extension strength, and performance of therapeutic exercises.  However she relies on frequent verbal and tactile cues due to cognitive impairment.  She is demonstrating improving hamstring muscle flexibility with exercises, but is unable to perform stretch independently.  She has not met established rehab goals at this time, due to inconsistency with home exercise program.  Patient will be discharged from skilled physical therapy at this time, and is expected to increasingly perform prescribed home exercise  program with caregiver, including walking for improved independence.       OBJECTIVE IMPAIRMENTS: Pain, posture, gait, hip ROM, LE and global strength  ACTIVITY LIMITATIONS: walking, standing, lifting, bending  PERSONAL FACTORS: See medical history and pertinent history   REHAB POTENTIAL: Fair chronic condition  CLINICAL DECISION MAKING: Stable/uncomplicated  EVALUATION COMPLEXITY: Low   GOALS:   SHORT TERM GOALS: Target date: 11/02/2022   Shelby will be >75% HEP compliant to improve carryover between sessions and facilitate independent management of condition  Evaluation: ongoing Goal status: NOT MET   LONG TERM GOALS: Target date: 11/30/2022   Sicilia will self report >/= 50% decrease in pain from evaluation   Evaluation/Baseline: 7/10 max pain Goal status: NOT MET    2.  Brittania will improve the following MMTs to >/= 4/5 to show improvement in strength:     Evaluation/Baseline: see chart in note  LE MMT:  MMT Right (Eval) Left (Eval) Right 11/15/22 Left 11/15/22  Hip flexion (L2, L3) 4+ 4 4+ 4+  Knee extension (L3) 4 4 4 4   Knee flexion 3+ 3+    Hip abduction 3+ 3+    Hip extension      Hip external rotation  Hip internal rotation      Hip adduction      Ankle dorsiflexion (L4)      Ankle plantarflexion (S1)      Ankle inversion      Ankle eversion      Great Toe ext (L5)      Grossly       (Blank rows = not tested, score listed is out of 5 possible points.  N = WNL, D = diminished, C = clear for gross weakness with myotome testing, * = concordant pain with testing)  Goal status: NOT MET   3.  Brinda will be able to maintain supine bridge for 40'' (dominant leg extended if 120'' reached) as evidence of improved hip extension and core strength (norm for healthy adult ~170'')   Evaluation/Baseline: 20'' Goal status: NOT MET   4.  Rileyann will improve hip ext ROM to neutral  Evaluation/Baseline: limited by 20 degrees Goal status:  NOT ASSESSED AT DISCHARGE   5.  Deyanna will express significant improvement in ability to walk, not limited by low back pain  Evaluation/Baseline: limited Goal status: NOT MET   PLAN: PT FREQUENCY: 1-2x/week  PT DURATION: 8 weeks  PLANNED INTERVENTIONS: Therapeutic exercises, Aquatic therapy, Therapeutic activity, Neuro Muscular re-education, Gait training, Patient/Family education, Joint mobilization, Dry Needling, Electrical stimulation, Spinal mobilization and/or manipulation, Moist heat, Taping, Vasopneumatic device, Ionotophoresis 4mg /ml Dexamethasone, and Manual therapy   Mauri Reading PT, DPT 11/15/2022, 6:28 PM

## 2024-05-14 ENCOUNTER — Other Ambulatory Visit: Payer: Self-pay | Admitting: Nurse Practitioner

## 2024-05-14 DIAGNOSIS — Z1231 Encounter for screening mammogram for malignant neoplasm of breast: Secondary | ICD-10-CM
